# Patient Record
Sex: Male | Born: 1935 | Race: White | Hispanic: No | Marital: Married | Smoking: Former smoker
Health system: Southern US, Community
[De-identification: ages and names within clinical notes are randomized; demographics above are authoritative.]

## PROBLEM LIST (undated history)

## (undated) DIAGNOSIS — N289 Disorder of kidney and ureter, unspecified: Secondary | ICD-10-CM

## (undated) DIAGNOSIS — I447 Left bundle-branch block, unspecified: Secondary | ICD-10-CM

## (undated) HISTORY — PX: BLADDER SURGERY: SHX569

## (undated) HISTORY — DX: Left bundle-branch block, unspecified: I44.7

## (undated) HISTORY — PX: TONSILLECTOMY: SUR1361

## (undated) HISTORY — DX: Disorder of kidney and ureter, unspecified: N28.9

---

## 2006-02-24 ENCOUNTER — Other Ambulatory Visit: Payer: Self-pay

## 2006-03-22 ENCOUNTER — Ambulatory Visit: Payer: Self-pay | Admitting: Urology

## 2009-06-06 ENCOUNTER — Ambulatory Visit: Payer: Self-pay | Admitting: Family Medicine

## 2009-06-12 ENCOUNTER — Ambulatory Visit: Payer: Self-pay | Admitting: Family Medicine

## 2009-06-15 ENCOUNTER — Inpatient Hospital Stay: Payer: Self-pay | Admitting: Student

## 2009-06-18 ENCOUNTER — Ambulatory Visit: Payer: Self-pay | Admitting: Oncology

## 2009-07-18 ENCOUNTER — Ambulatory Visit: Payer: Self-pay | Admitting: Oncology

## 2009-07-21 ENCOUNTER — Ambulatory Visit: Payer: Self-pay | Admitting: Family Medicine

## 2009-08-12 ENCOUNTER — Ambulatory Visit: Payer: Self-pay | Admitting: Vascular Surgery

## 2010-10-02 ENCOUNTER — Emergency Department: Payer: Self-pay | Admitting: Emergency Medicine

## 2011-06-28 ENCOUNTER — Ambulatory Visit: Payer: Self-pay | Admitting: Urology

## 2011-06-28 DIAGNOSIS — Z01810 Encounter for preprocedural cardiovascular examination: Secondary | ICD-10-CM

## 2011-06-29 ENCOUNTER — Encounter: Payer: Self-pay | Admitting: Cardiology

## 2011-07-16 ENCOUNTER — Encounter: Payer: Self-pay | Admitting: Cardiology

## 2011-07-19 ENCOUNTER — Encounter: Payer: Self-pay | Admitting: Cardiology

## 2011-07-19 ENCOUNTER — Ambulatory Visit (INDEPENDENT_AMBULATORY_CARE_PROVIDER_SITE_OTHER): Payer: BC Managed Care – PPO | Admitting: Cardiology

## 2011-07-19 VITALS — BP 130/70 | HR 80 | Ht 70.0 in | Wt 206.0 lb

## 2011-07-19 DIAGNOSIS — Z01810 Encounter for preprocedural cardiovascular examination: Secondary | ICD-10-CM | POA: Insufficient documentation

## 2011-07-19 DIAGNOSIS — R9431 Abnormal electrocardiogram [ECG] [EKG]: Secondary | ICD-10-CM

## 2011-07-19 NOTE — Progress Notes (Signed)
HPI: 76 year old male with no prior cardiac history for evaluation of abnormal electrocardiogram. Patient recently noted to have a left bundle branch block. We were therefore asked to further evaluate. Note he is scheduled to have a small tumor removed from his bladder and will require general anesthesia. He has dyspnea with more extreme activities but not routine activities. No orthopnea, PND, pedal edema, chest pain, claudication or syncope.  Current Outpatient Prescriptions  Medication Sig Dispense Refill  . dexamethasone (DECADRON) 4 MG tablet 5 tabs po weekly       . doxercalciferol (HECTOROL) 0.5 MCG capsule Take 1 mcg by mouth daily.        . insulin glargine (LANTUS) 100 UNIT/ML injection Inject into the skin. Sliding scale       . insulin lispro (HUMALOG) 100 UNIT/ML injection Inject into the skin. Sliding scale       . lanthanum (FOSRENOL) 1000 MG chewable tablet Chew 1,000 mg by mouth 2 (two) times daily with a meal.        . latanoprost (XALATAN) 0.005 % ophthalmic solution 1 drop at bedtime.        Marland Kitchen lenalidomide (REVLIMID) 10 MG capsule Take 10 mg by mouth daily.        . magnesium oxide (MAG-OX) 400 MG tablet Take 400 mg by mouth 2 (two) times daily.          No Known Allergies  Past Medical History  Diagnosis Date  . LBBB (left bundle branch block)   . Multiple myeloma   . Diabetes mellitus   . Renal insufficiency     Past Surgical History  Procedure Date  . Bladder surgery   . Tonsillectomy     History   Social History  . Marital Status: Married    Spouse Name: N/A    Number of Children: 2  . Years of Education: N/A   Occupational History  . Not on file.   Social History Main Topics  . Smoking status: Former Games developer  . Smokeless tobacco: Not on file  . Alcohol Use: No  . Drug Use: Not on file  . Sexually Active: Not on file   Other Topics Concern  . Not on file   Social History Narrative  . No narrative on file    No family history on  file.  ROS: no fevers or chills, productive cough, hemoptysis, dysphasia, odynophagia, melena, hematochezia, dysuria, hematuria, rash, seizure activity, orthopnea, PND, pedal edema, claudication. Remaining systems are negative.  Physical Exam: General:  Well developed/well nourished in NAD Skin warm/dry Patient not depressed No peripheral clubbing Back-normal HEENT-normal/normal eyelids Neck supple/normal carotid upstroke bilaterally; no bruits; no JVD; no thyromegaly chest - CTA/ normal expansion CV - RRR/normal S1 and S2; no murmurs, rubs or gallops;  PMI nondisplaced Abdomen -NT/ND, no HSM, no mass, + bowel sounds, no bruit 2+ femoral pulses, no bruits Ext-no edema, chords, 2+ DP Neuro-grossly nonfocal  ECG 06/29/11 Sinus with LBBB

## 2011-07-19 NOTE — Patient Instructions (Signed)
Your physician has requested that you have an adenosine myoview. For further information please visit [email or web address]. Please follow instruction sheet, as given.   

## 2011-07-19 NOTE — Assessment & Plan Note (Signed)
Adenosine Myoview for risk stratification preoperatively.

## 2011-07-19 NOTE — Assessment & Plan Note (Signed)
Patient with left bundle branch block on electrocardiogram. Also with diabetes mellitus and mild dyspnea on exertion. Scheduled adenosine Myoview to exclude ischemia and to quantify LV function.

## 2011-07-21 ENCOUNTER — Encounter: Payer: Self-pay | Admitting: *Deleted

## 2011-07-28 ENCOUNTER — Encounter (HOSPITAL_COMMUNITY): Payer: BC Managed Care – PPO | Admitting: Radiology

## 2011-07-29 ENCOUNTER — Ambulatory Visit (HOSPITAL_COMMUNITY): Payer: BC Managed Care – PPO | Attending: Cardiology | Admitting: Radiology

## 2011-07-29 VITALS — Ht 70.0 in | Wt 202.0 lb

## 2011-07-29 DIAGNOSIS — R0989 Other specified symptoms and signs involving the circulatory and respiratory systems: Secondary | ICD-10-CM

## 2011-07-29 DIAGNOSIS — I447 Left bundle-branch block, unspecified: Secondary | ICD-10-CM

## 2011-07-29 DIAGNOSIS — E119 Type 2 diabetes mellitus without complications: Secondary | ICD-10-CM

## 2011-07-29 DIAGNOSIS — R9431 Abnormal electrocardiogram [ECG] [EKG]: Secondary | ICD-10-CM

## 2011-07-29 DIAGNOSIS — R55 Syncope and collapse: Secondary | ICD-10-CM

## 2011-07-29 MED ORDER — TECHNETIUM TC 99M TETROFOSMIN IV KIT
33.0000 | PACK | Freq: Once | INTRAVENOUS | Status: AC | PRN
  Administered 2011-07-29: 33 via INTRAVENOUS

## 2011-07-29 MED ORDER — ADENOSINE (DIAGNOSTIC) 3 MG/ML IV SOLN
0.5600 mg/kg | Freq: Once | INTRAVENOUS | Status: AC
  Administered 2011-07-29: 51.3 mg via INTRAVENOUS

## 2011-07-29 MED ORDER — TECHNETIUM TC 99M TETROFOSMIN IV KIT
11.0000 | PACK | Freq: Once | INTRAVENOUS | Status: AC | PRN
  Administered 2011-07-29: 11 via INTRAVENOUS

## 2011-07-29 NOTE — Progress Notes (Signed)
[facility] SITE 3 NUCLEAR MED [address] [place] [address] [phone]  Cardiology Nuclear Med Study  MCKENNA BORUFF is a 76 y.o. male [id number] 01-26-1935   Nuclear Med Background Indication for Stress Test:  Evaluation for Ischemia, Pending Surgical Clearance: removal of tumor from bladder-Dr. Orson Slick, and Abnormal EKG History:  No previous documented CAD Cardiac Risk Factors: History of Smoking, IDDM Type 2 and LBBB  Symptoms:  DOE and Syncope   Nuclear Pre-Procedure Caffeine/Decaff Intake:  None NPO After: 9:00pm   Lungs:  clear IV 0.9% NS with Angio Cath:  22g  IV Site: R Forearm  IV Started by:  Stanton Kidney, EMT-P  Chest Size (in):  44 Cup Size: n/a  Height: 5\' 10"  (1.778 m)  Weight:  202 lb (91.627 kg)  BMI:  Body mass index is 28.98 kg/(m^2). Tech Comments:  NA    Nuclear Med Study 1 or 2 day study: 1 day  Stress Test Type:  Adenosine  Reading MD: Marca Ancona, MD  Order Authorizing Provider:  B.Korynn Kenedy  Resting Radionuclide: Technetium 3m Tetrofosmin  Resting Radionuclide Dose: 11 mCi   Stress Radionuclide:  Technetium 71m Tetrofosmin  Stress Radionuclide Dose: 33 mCi           Stress Protocol Rest HR: 64 Stress HR: 83  Rest BP: 106/57 Stress BP: 105/38  Exercise Time (min): n/a METS: n/a   Predicted Max HR: 146 bpm % Max HR: 56.85 bpm Rate Pressure Product: 8798   Dose of Adenosine (mg):  51.4 Dose of Lexiscan: n/a mg  Dose of Atropine (mg): n/a Dose of Dobutamine: n/a mcg/kg/min (at max HR)  Stress Test Technologist: Milana Na, EMT-P  Nuclear Technologist:  Domenic Polite, CNMT     Rest Procedure:  Myocardial perfusion imaging was performed at rest 45 minutes following the intravenous administration of Technetium 53m Tetrofosmin. Rest ECG: NSR-LBBB  Stress Procedure:  The patient received IV adenosine at 140 mcg/kg/min for 4 minutes.  2nd degree AVB with infusion. There were non Dx changes from LBBB with infusion.   Technetium 3m Tetrofosmin was injected at the 2 minute mark and quantitative spect images were obtained after a 45 minute delay. Stress ECG: Uninteretable due to baseline LBBB  QPS Raw Data Images:  Acquisition technically good; evidence of LVE. Stress Images:  There is decreased uptake in the septum and apex. Rest Images:  There is decreased uptake in the septum and apex, less prominent compared to the stress images. Subtraction (SDS):  These findings are consistent with prior septal and apical infarct and mild peri-infarct ischemia; some of defect may be related to LBBB. Transient Ischemic Dilatation (Normal <1.22):  1.04 Lung/Heart Ratio (Normal <0.45):  .30  Quantitative Gated Spect Images QGS EDV:  141 ml QGS ESV:  82 ml QGS cine images:  Septal and apical hypokinesis. QGS EF: 42%  Impression Exercise Capacity:  Adenosine study with no exercise. BP Response:  Hypotensive blood pressure response. Clinical Symptoms:  No chest pain. ECG Impression:  Baseline:  LBBB.  EKG uninterpretable due to LBBB at rest and stress. Comparison with Prior Nuclear Study: No images to compare  Overall Impression:  Abnormal stress nuclear study with moderate size, partially reversible septal and apical defect consistent with prior septal and apical infarct and mild peri-infarct ischemia; some of defect may be related to LBBB.   Olga Millers

## 2011-08-04 ENCOUNTER — Telehealth: Payer: Self-pay | Admitting: Cardiology

## 2011-08-04 NOTE — Telephone Encounter (Signed)
Pt wife calling wanting to get surgical clearance for skin cancer removal on face as well as bladder cancer. Please return pt call to discuss further.  Please call pt at work if needed,Pt work number: [phone]

## 2011-08-04 NOTE — Telephone Encounter (Signed)
Spoke with pt, aware of abnormal myoview. Follow up scheduled [name]

## 2011-08-17 ENCOUNTER — Ambulatory Visit: Payer: BC Managed Care – PPO | Admitting: Cardiology

## 2011-08-20 ENCOUNTER — Encounter: Payer: Self-pay | Admitting: Cardiology

## 2011-08-20 ENCOUNTER — Ambulatory Visit (INDEPENDENT_AMBULATORY_CARE_PROVIDER_SITE_OTHER): Payer: BC Managed Care – PPO | Admitting: Cardiology

## 2011-08-20 VITALS — BP 128/72 | HR 88 | Ht 70.0 in | Wt 207.0 lb

## 2011-08-20 DIAGNOSIS — I447 Left bundle-branch block, unspecified: Secondary | ICD-10-CM | POA: Insufficient documentation

## 2011-08-20 DIAGNOSIS — R9430 Abnormal result of cardiovascular function study, unspecified: Secondary | ICD-10-CM | POA: Insufficient documentation

## 2011-08-20 NOTE — Patient Instructions (Signed)
Your physician has requested that you have an echocardiogram. Echocardiography is a painless test that uses sound waves to create images of your heart. It provides your doctor with information about the size and shape of your heart and how well your heart's chambers and valves are working. This procedure takes approximately one hour. There are no restrictions for this procedure.   

## 2011-08-20 NOTE — Progress Notes (Signed)
HPI Bill Foster comes in today for evaluation and management of an abnormal nuclear stress study.  He was evaluated by Dr. Jens Foster several weeks ago for a preop clearance for a left bundle-branch block. He is asymptomatic except for some minimal dyspnea and fatigue. He does have diabetes as a major risk factor for coronary disease.  His nuclear study shows an ejection fraction of 42% with abnormal septal and apical Bill Foster motion from his left bundle-branch block. In addition he had a suggestion of some peri-infarct ischemia.  He really wants to get his tumor surgery done as soon as possible.  He has a history of contrast-induced nephropathy. He also has multiple myeloma. He realizes he is not a contrast candidate. Past Medical History  Diagnosis Date  . LBBB (left bundle branch block)   . Multiple myeloma   . Diabetes mellitus   . Renal insufficiency     Past Surgical History  Procedure Date  . Bladder surgery   . Tonsillectomy     No family history on file.  History   Social History  . Marital Status: Married    Spouse Name: N/A    Number of Children: 2  . Years of Education: N/A   Occupational History  . Not on file.   Social History Main Topics  . Smoking status: Former Games developer  . Smokeless tobacco: Not on file  . Alcohol Use: No  . Drug Use: Not on file  . Sexually Active: Not on file   Other Topics Concern  . Not on file   Social History Narrative  . No narrative on file    No Known Allergies  Current Outpatient Prescriptions  Medication Sig Dispense Refill  . dexamethasone (DECADRON) 4 MG tablet 5 tabs po weekly       . insulin glargine (LANTUS) 100 UNIT/ML injection Inject into the skin. Sliding scale       . insulin lispro (HUMALOG) 100 UNIT/ML injection Inject into the skin. Sliding scale       . lanthanum (FOSRENOL) 1000 MG chewable tablet Chew 1,000 mg by mouth 2 (two) times daily with a meal.        . latanoprost (XALATAN) 0.005 % ophthalmic solution  1 drop at bedtime.        Marland Kitchen lenalidomide (REVLIMID) 10 MG capsule Take 10 mg by mouth daily.        . magnesium oxide (MAG-OX) 400 MG tablet Take 400 mg by mouth 2 (two) times daily.        Marland Kitchen doxercalciferol (HECTOROL) 0.5 MCG capsule Take 1 mcg by mouth daily.          ROS Negative other than HPI.   PE   No change from Dr. Ludwig Foster baseline evaluation. Filed Vitals:   08/20/11 1507  BP: 128/72  Pulse: 88  Height: 5\' 10"  (1.778 m)  Weight: 207 lb (93.895 kg)    EKG  Labs and Studies Reviewed.   No results found for this basename: WBC, HGB, HCT, MCV, PLT      Chemistry   No results found for this basename: NA, K, CL, CO2, BUN, CREATININE, GLU   No results found for this basename: CALCIUM, ALKPHOS, AST, ALT, BILITOT       No results found for this basename: CHOL   No results found for this basename: HDL   No results found for this basename: LDLCALC   No results found for this basename: TRIG   No results found for this basename:  CHOLHDL   No results found for this basename: HGBA1C   No results found for this basename: ALT, AST, GGT, ALKPHOS, BILITOT   No results found for this basename: TSH

## 2011-08-25 ENCOUNTER — Institutional Professional Consult (permissible substitution): Payer: BC Managed Care – PPO | Admitting: Internal Medicine

## 2011-08-26 ENCOUNTER — Ambulatory Visit (HOSPITAL_COMMUNITY): Payer: BC Managed Care – PPO | Attending: Cardiology

## 2011-08-26 DIAGNOSIS — I059 Rheumatic mitral valve disease, unspecified: Secondary | ICD-10-CM | POA: Insufficient documentation

## 2011-08-26 DIAGNOSIS — R0609 Other forms of dyspnea: Secondary | ICD-10-CM | POA: Insufficient documentation

## 2011-08-26 DIAGNOSIS — R0989 Other specified symptoms and signs involving the circulatory and respiratory systems: Secondary | ICD-10-CM

## 2011-08-26 DIAGNOSIS — I447 Left bundle-branch block, unspecified: Secondary | ICD-10-CM

## 2011-08-26 DIAGNOSIS — I079 Rheumatic tricuspid valve disease, unspecified: Secondary | ICD-10-CM | POA: Insufficient documentation

## 2011-08-26 DIAGNOSIS — R5383 Other fatigue: Secondary | ICD-10-CM

## 2011-08-30 ENCOUNTER — Ambulatory Visit: Payer: BC Managed Care – PPO | Admitting: Cardiology

## 2011-09-01 ENCOUNTER — Encounter: Payer: Self-pay | Admitting: *Deleted

## 2011-10-04 ENCOUNTER — Ambulatory Visit: Payer: Self-pay | Admitting: Urology

## 2011-10-06 LAB — PATHOLOGY REPORT

## 2011-10-07 ENCOUNTER — Ambulatory Visit: Payer: Self-pay | Admitting: Otolaryngology

## 2011-10-19 ENCOUNTER — Ambulatory Visit: Payer: Self-pay | Admitting: Internal Medicine

## 2011-11-02 LAB — CBC
HGB: 10.4 g/dL — ABNORMAL LOW (ref 13.0–18.0)
MCH: 32.1 pg (ref 26.0–34.0)
MCHC: 33 g/dL (ref 32.0–36.0)
Platelet: 49 10*3/uL — ABNORMAL LOW (ref 150–440)
RDW: 16 % — ABNORMAL HIGH (ref 11.5–14.5)

## 2011-11-03 ENCOUNTER — Inpatient Hospital Stay: Payer: Self-pay | Admitting: Internal Medicine

## 2011-11-03 LAB — BASIC METABOLIC PANEL
Calcium, Total: 8.9 mg/dL (ref 8.5–10.1)
Chloride: 105 mmol/L (ref 98–107)
Co2: 23 mmol/L (ref 21–32)
Creatinine: 4.02 mg/dL — ABNORMAL HIGH (ref 0.60–1.30)
EGFR (African American): 19 — ABNORMAL LOW
Potassium: 4 mmol/L (ref 3.5–5.1)
Sodium: 143 mmol/L (ref 136–145)

## 2011-11-03 LAB — TROPONIN I: Troponin-I: 0.03 ng/mL

## 2011-11-03 LAB — APTT: Activated PTT: 29.2 secs (ref 23.6–35.9)

## 2011-11-03 LAB — CK TOTAL AND CKMB (NOT AT ARMC)
CK-MB: 2.9 ng/mL (ref 0.5–3.6)
CK-MB: 3.1 ng/mL (ref 0.5–3.6)

## 2011-11-04 LAB — COMPREHENSIVE METABOLIC PANEL
Albumin: 3 g/dL — ABNORMAL LOW (ref 3.4–5.0)
Anion Gap: 14 (ref 7–16)
BUN: 57 mg/dL — ABNORMAL HIGH (ref 7–18)
Bilirubin,Total: 0.5 mg/dL (ref 0.2–1.0)
Chloride: 105 mmol/L (ref 98–107)
Creatinine: 3.67 mg/dL — ABNORMAL HIGH (ref 0.60–1.30)
EGFR (African American): 21 — ABNORMAL LOW
Glucose: 115 mg/dL — ABNORMAL HIGH (ref 65–99)
Osmolality: 300 (ref 275–301)
Potassium: 4.2 mmol/L (ref 3.5–5.1)
Sodium: 142 mmol/L (ref 136–145)
Total Protein: 6 g/dL — ABNORMAL LOW (ref 6.4–8.2)

## 2011-11-04 LAB — CBC WITH DIFFERENTIAL/PLATELET
Basophil #: 0 10*3/uL (ref 0.0–0.1)
Eosinophil #: 0.3 10*3/uL (ref 0.0–0.7)
Eosinophil: 3 %
HCT: 27.1 % — ABNORMAL LOW (ref 40.0–52.0)
Lymphocyte %: 12.1 %
MCHC: 33 g/dL (ref 32.0–36.0)
MCV: 98 fL (ref 80–100)
Monocyte %: 22.7 %
Monocytes: 18 %
Neutrophil #: 3.8 10*3/uL (ref 1.4–6.5)
Neutrophil %: 59.8 %
Platelet: 46 10*3/uL — ABNORMAL LOW (ref 150–440)
RDW: 15.5 % — ABNORMAL HIGH (ref 11.5–14.5)
Segmented Neutrophils: 65 %
WBC: 6.3 10*3/uL (ref 3.8–10.6)

## 2011-11-04 LAB — APTT
Activated PTT: 67.9 secs — ABNORMAL HIGH (ref 23.6–35.9)
Activated PTT: 153.1 secs — ABNORMAL HIGH (ref 23.6–35.9)

## 2011-11-04 LAB — MAGNESIUM: Magnesium: 2 mg/dL

## 2011-11-05 LAB — CBC WITH DIFFERENTIAL/PLATELET
Bands: 1 %
Basophil: 2 %
Comment - H1-Com2: NORMAL
Eosinophil: 8 %
HCT: 26.1 % — ABNORMAL LOW (ref 40.0–52.0)
HGB: 8.6 g/dL — ABNORMAL LOW (ref 13.0–18.0)
Lymphocytes: 12 %
MCHC: 33.1 g/dL (ref 32.0–36.0)
Platelet: 54 10*3/uL — ABNORMAL LOW (ref 150–440)
RDW: 15.4 % — ABNORMAL HIGH (ref 11.5–14.5)
Segmented Neutrophils: 62 %
WBC: 5.5 10*3/uL (ref 3.8–10.6)

## 2011-11-05 LAB — BASIC METABOLIC PANEL
Anion Gap: 14 (ref 7–16)
BUN: 51 mg/dL — ABNORMAL HIGH (ref 7–18)
Calcium, Total: 8.5 mg/dL (ref 8.5–10.1)
Chloride: 110 mmol/L — ABNORMAL HIGH (ref 98–107)
Co2: 21 mmol/L (ref 21–32)
Creatinine: 3.38 mg/dL — ABNORMAL HIGH (ref 0.60–1.30)
EGFR (African American): 23 — ABNORMAL LOW
EGFR (Non-African Amer.): 19 — ABNORMAL LOW
Glucose: 80 mg/dL (ref 65–99)
Osmolality: 301 (ref 275–301)
Potassium: 3.9 mmol/L (ref 3.5–5.1)
Sodium: 145 mmol/L (ref 136–145)

## 2011-11-05 LAB — APTT
Activated PTT: >160 secs (ref 23.6–35.9)
Activated PTT: 147.1 secs — ABNORMAL HIGH (ref 23.6–35.9)

## 2011-11-05 LAB — PROTIME-INR
INR: 1.1
Prothrombin Time: 15 secs — ABNORMAL HIGH (ref 11.5–14.7)

## 2011-11-05 LAB — HEMOGLOBIN A1C: Hemoglobin A1C: 8.2 % — ABNORMAL HIGH (ref 4.2–6.3)

## 2011-11-06 LAB — BASIC METABOLIC PANEL
Anion Gap: 14 (ref 7–16)
BUN: 49 mg/dL — ABNORMAL HIGH (ref 7–18)
Calcium, Total: 8.6 mg/dL (ref 8.5–10.1)
Chloride: 110 mmol/L — ABNORMAL HIGH (ref 98–107)
Co2: 21 mmol/L (ref 21–32)
Osmolality: 303 (ref 275–301)
Potassium: 3.9 mmol/L (ref 3.5–5.1)
Sodium: 145 mmol/L (ref 136–145)

## 2011-11-06 LAB — URINALYSIS, COMPLETE
Bacteria: NONE SEEN
Glucose,UR: 50 mg/dL (ref 0–75)
Ketone: NEGATIVE
Leukocyte Esterase: NEGATIVE
Protein: 30
RBC,UR: 1165 /HPF (ref 0–5)
Specific Gravity: 1.008 (ref 1.003–1.030)
WBC UR: 9 /HPF (ref 0–5)

## 2011-11-06 LAB — APTT
Activated PTT: >160 secs (ref 23.6–35.9)
Activated PTT: 80.7 secs — ABNORMAL HIGH (ref 23.6–35.9)

## 2011-11-06 LAB — CBC WITH DIFFERENTIAL/PLATELET
Basophil %: 1.6 %
Basophil: 2 %
Eosinophil #: 0.4 10*3/uL (ref 0.0–0.7)
Eosinophil %: 8.7 %
Eosinophil: 10 %
HCT: 26.3 % — ABNORMAL LOW (ref 40.0–52.0)
HGB: 8.8 g/dL — ABNORMAL LOW (ref 13.0–18.0)
MCH: 32.3 pg (ref 26.0–34.0)
MCHC: 33.3 g/dL (ref 32.0–36.0)
Monocyte #: 1.1 10*3/uL — ABNORMAL HIGH (ref 0.0–0.7)
Monocytes: 19 %
Neutrophil #: 2.4 10*3/uL (ref 1.4–6.5)
RBC: 2.71 10*6/uL — ABNORMAL LOW (ref 4.40–5.90)
Segmented Neutrophils: 48 %

## 2011-11-06 LAB — PROTIME-INR
INR: 1.3
Prothrombin Time: 16.3 secs — ABNORMAL HIGH (ref 11.5–14.7)

## 2011-11-07 LAB — CBC WITH DIFFERENTIAL/PLATELET
Bands: 3 %
Basophil #: 0.1 10*3/uL (ref 0.0–0.1)
Eosinophil %: 10.4 %
Eosinophil: 9 %
Lymphocyte #: 0.8 10*3/uL — ABNORMAL LOW (ref 1.0–3.6)
Lymphocyte %: 19.4 %
Lymphocytes: 21 %
MCHC: 33.7 g/dL (ref 32.0–36.0)
MCV: 97 fL (ref 80–100)
Monocyte %: 20.6 %
Monocytes: 17 %
Neutrophil %: 48 %
Platelet: 70 10*3/uL — ABNORMAL LOW (ref 150–440)
RBC: 2.69 10*6/uL — ABNORMAL LOW (ref 4.40–5.90)
RDW: 15.2 % — ABNORMAL HIGH (ref 11.5–14.5)
Segmented Neutrophils: 47 %
WBC: 4.3 10*3/uL (ref 3.8–10.6)

## 2011-11-07 LAB — PROTIME-INR: Prothrombin Time: 17.7 secs — ABNORMAL HIGH (ref 11.5–14.7)

## 2011-11-07 LAB — BASIC METABOLIC PANEL
Calcium, Total: 8.7 mg/dL (ref 8.5–10.1)
Chloride: 112 mmol/L — ABNORMAL HIGH (ref 98–107)
Co2: 19 mmol/L — ABNORMAL LOW (ref 21–32)
Creatinine: 3.2 mg/dL — ABNORMAL HIGH (ref 0.60–1.30)
EGFR (African American): 25 — ABNORMAL LOW
Potassium: 4.2 mmol/L (ref 3.5–5.1)
Sodium: 146 mmol/L — ABNORMAL HIGH (ref 136–145)

## 2011-11-08 LAB — CBC WITH DIFFERENTIAL/PLATELET
Basophil %: 2 %
Eosinophil %: 9.7 %
HCT: 25.4 % — ABNORMAL LOW (ref 40.0–52.0)
HGB: 8.4 g/dL — ABNORMAL LOW (ref 13.0–18.0)
Lymphocyte #: 0.9 10*3/uL — ABNORMAL LOW (ref 1.0–3.6)
Lymphocyte %: 21.1 %
MCHC: 33.2 g/dL (ref 32.0–36.0)
MCV: 97 fL (ref 80–100)
Monocyte #: 0.8 10*3/uL — ABNORMAL HIGH (ref 0.0–0.7)
Monocyte %: 19.5 %
Platelet: 76 10*3/uL — ABNORMAL LOW (ref 150–440)
RDW: 15.3 % — ABNORMAL HIGH (ref 11.5–14.5)
WBC: 4.2 10*3/uL (ref 3.8–10.6)

## 2011-11-08 LAB — PROTIME-INR
INR: 1.9
Prothrombin Time: 22.4 secs — ABNORMAL HIGH (ref 11.5–14.7)

## 2011-11-08 LAB — RENAL FUNCTION PANEL
Albumin: 2.6 g/dL — ABNORMAL LOW (ref 3.4–5.0)
Anion Gap: 14 (ref 7–16)
BUN: 42 mg/dL — ABNORMAL HIGH (ref 7–18)
Calcium, Total: 8.7 mg/dL (ref 8.5–10.1)
Chloride: 110 mmol/L — ABNORMAL HIGH (ref 98–107)
EGFR (African American): 25 — ABNORMAL LOW
Glucose: 151 mg/dL — ABNORMAL HIGH (ref 65–99)
Osmolality: 298 (ref 275–301)
Phosphorus: 4.3 mg/dL (ref 2.5–4.9)

## 2011-11-08 LAB — APTT: Activated PTT: 81.9 secs — ABNORMAL HIGH (ref 23.6–35.9)

## 2011-11-19 ENCOUNTER — Ambulatory Visit: Payer: Self-pay | Admitting: Internal Medicine

## 2011-11-29 ENCOUNTER — Ambulatory Visit: Payer: Self-pay | Admitting: Ophthalmology

## 2011-11-29 LAB — PROTIME-INR
INR: 1.8
Prothrombin Time: 21.5 secs — ABNORMAL HIGH (ref 11.5–14.7)

## 2011-12-27 ENCOUNTER — Ambulatory Visit: Payer: Self-pay | Admitting: Ophthalmology

## 2012-02-18 ENCOUNTER — Other Ambulatory Visit: Payer: Self-pay | Admitting: Family Medicine

## 2012-02-18 LAB — CBC WITH DIFFERENTIAL/PLATELET
Basophil #: 0 10*3/uL (ref 0.0–0.1)
Eosinophil #: 0 10*3/uL (ref 0.0–0.7)
HCT: 30.9 % — ABNORMAL LOW (ref 40.0–52.0)
HGB: 10.3 g/dL — ABNORMAL LOW (ref 13.0–18.0)
Lymphocyte #: 0.5 10*3/uL — ABNORMAL LOW (ref 1.0–3.6)
MCH: 32 pg (ref 26.0–34.0)
MCHC: 33.3 g/dL (ref 32.0–36.0)
MCV: 96 fL (ref 80–100)
Monocyte #: 0.5 x10 3/mm (ref 0.2–1.0)
Monocyte %: 16.5 %
Neutrophil #: 2 10*3/uL (ref 1.4–6.5)
Neutrophil %: 66.6 %
Platelet: 64 10*3/uL — ABNORMAL LOW (ref 150–440)
RDW: 16.3 % — ABNORMAL HIGH (ref 11.5–14.5)
WBC: 3.1 10*3/uL — ABNORMAL LOW (ref 3.8–10.6)

## 2012-03-09 ENCOUNTER — Ambulatory Visit: Payer: Self-pay | Admitting: Urology

## 2012-03-09 LAB — BASIC METABOLIC PANEL
Anion Gap: 12 (ref 7–16)
BUN: 49 mg/dL — ABNORMAL HIGH (ref 7–18)
Calcium, Total: 9 mg/dL (ref 8.5–10.1)
Chloride: 103 mmol/L (ref 98–107)
Co2: 22 mmol/L (ref 21–32)
Creatinine: 3.18 mg/dL — ABNORMAL HIGH (ref 0.60–1.30)
EGFR (African American): 21 — ABNORMAL LOW
EGFR (Non-African Amer.): 18 — ABNORMAL LOW
Glucose: 164 mg/dL — ABNORMAL HIGH (ref 65–99)
Potassium: 3.9 mmol/L (ref 3.5–5.1)
Sodium: 137 mmol/L (ref 136–145)

## 2012-03-20 ENCOUNTER — Ambulatory Visit: Payer: Self-pay | Admitting: Urology

## 2012-03-20 LAB — CBC WITH DIFFERENTIAL/PLATELET
Basophil #: 0.1 10*3/uL (ref 0.0–0.1)
Basophil %: 1.3 %
Eosinophil %: 3.7 %
HCT: 28.4 % — ABNORMAL LOW (ref 40.0–52.0)
Lymphocyte #: 0.8 10*3/uL — ABNORMAL LOW (ref 1.0–3.6)
MCHC: 33.1 g/dL (ref 32.0–36.0)
Neutrophil #: 3.4 10*3/uL (ref 1.4–6.5)
Neutrophil %: 60.1 %
Platelet: 92 10*3/uL — ABNORMAL LOW (ref 150–440)
RBC: 2.92 10*6/uL — ABNORMAL LOW (ref 4.40–5.90)
WBC: 5.6 10*3/uL (ref 3.8–10.6)

## 2012-03-20 LAB — PROTIME-INR: Prothrombin Time: 14.4 secs (ref 11.5–14.7)

## 2012-09-04 ENCOUNTER — Ambulatory Visit: Payer: Self-pay | Admitting: Vascular Surgery

## 2012-09-04 LAB — PROTIME-INR: INR: 1.6

## 2013-05-21 ENCOUNTER — Ambulatory Visit: Payer: Self-pay | Admitting: Urology

## 2013-05-21 LAB — BASIC METABOLIC PANEL
Anion Gap: 10 (ref 7–16)
BUN: 76 mg/dL — ABNORMAL HIGH (ref 7–18)
Calcium, Total: 8.9 mg/dL (ref 8.5–10.1)
Chloride: 107 mmol/L (ref 98–107)
EGFR (African American): 18 — ABNORMAL LOW
EGFR (Non-African Amer.): 15 — ABNORMAL LOW
Glucose: 240 mg/dL — ABNORMAL HIGH (ref 65–99)
Osmolality: 308 (ref 275–301)
Potassium: 4.4 mmol/L (ref 3.5–5.1)

## 2013-05-21 LAB — CBC WITH DIFFERENTIAL/PLATELET
Basophil #: 0 10*3/uL (ref 0.0–0.1)
Basophil %: 1.3 %
Eosinophil %: 12.6 %
HCT: 27.1 % — ABNORMAL LOW (ref 40.0–52.0)
HGB: 9.5 g/dL — ABNORMAL LOW (ref 13.0–18.0)
Lymphocyte #: 0.5 10*3/uL — ABNORMAL LOW (ref 1.0–3.6)
MCHC: 35.2 g/dL (ref 32.0–36.0)
MCV: 102 fL — ABNORMAL HIGH (ref 80–100)
Monocyte %: 30.3 %
Neutrophil #: 1.2 10*3/uL — ABNORMAL LOW (ref 1.4–6.5)
Neutrophil %: 40.2 %
Platelet: 49 10*3/uL — ABNORMAL LOW (ref 150–440)

## 2013-06-18 ENCOUNTER — Ambulatory Visit: Payer: Self-pay | Admitting: Hematology and Oncology

## 2013-06-27 ENCOUNTER — Ambulatory Visit: Payer: Self-pay | Admitting: Urology

## 2013-07-12 LAB — BASIC METABOLIC PANEL
Calcium, Total: 8.7 mg/dL (ref 8.5–10.1)
Co2: 16 mmol/L — ABNORMAL LOW (ref 21–32)
Creatinine: 4.85 mg/dL — ABNORMAL HIGH (ref 0.60–1.30)
Glucose: 284 mg/dL — ABNORMAL HIGH (ref 65–99)
Potassium: 4.4 mmol/L (ref 3.5–5.1)

## 2013-07-12 LAB — CBC WITH DIFFERENTIAL/PLATELET
Comment - H1-Com3: NORMAL
HCT: 30.2 % — ABNORMAL LOW (ref 40.0–52.0)
HGB: 10.2 g/dL — ABNORMAL LOW (ref 13.0–18.0)
Lymphocytes: 40 %
MCH: 33 pg (ref 26.0–34.0)
MCHC: 33.9 g/dL (ref 32.0–36.0)
Monocytes: 18 %
RDW: 20.1 % — ABNORMAL HIGH (ref 11.5–14.5)
WBC: 1.3 10*3/uL — CL (ref 3.8–10.6)

## 2013-07-12 LAB — TROPONIN I: Troponin-I: 1.3 ng/mL — ABNORMAL HIGH

## 2013-07-12 LAB — HEPATIC FUNCTION PANEL A (ARMC)
Albumin: 3.3 g/dL — ABNORMAL LOW (ref 3.4–5.0)
Bilirubin,Total: 0.3 mg/dL (ref 0.2–1.0)
SGPT (ALT): 69 U/L (ref 12–78)
Total Protein: 6.8 g/dL (ref 6.4–8.2)

## 2013-07-12 LAB — PROTIME-INR
INR: 1.1
Prothrombin Time: 14.8 secs — ABNORMAL HIGH (ref 11.5–14.7)

## 2013-07-13 ENCOUNTER — Inpatient Hospital Stay: Payer: Self-pay | Admitting: Family Medicine

## 2013-07-13 DIAGNOSIS — I059 Rheumatic mitral valve disease, unspecified: Secondary | ICD-10-CM

## 2013-07-13 DIAGNOSIS — I214 Non-ST elevation (NSTEMI) myocardial infarction: Secondary | ICD-10-CM

## 2013-07-13 LAB — CK TOTAL AND CKMB (NOT AT ARMC)
CK, Total: 98 U/L (ref 35–232)
CK, Total: 72 U/L (ref 35–232)
CK-MB: 3.2 ng/mL (ref 0.5–3.6)
CK-MB: 2.3 ng/mL (ref 0.5–3.6)
CK-MB: 2 ng/mL (ref 0.5–3.6)

## 2013-07-13 LAB — CBC WITH DIFFERENTIAL/PLATELET
Basophil #: 0 10*3/uL (ref 0.0–0.1)
Eosinophil %: 0.3 %
HCT: 23.8 % — ABNORMAL LOW (ref 40.0–52.0)
Lymphocyte #: 0.2 10*3/uL — ABNORMAL LOW (ref 1.0–3.6)
MCHC: 34.7 g/dL (ref 32.0–36.0)
Monocyte #: 0.2 x10 3/mm (ref 0.2–1.0)
Monocyte %: 26.9 %
Platelet: 14 10*3/uL — CL (ref 150–440)
RDW: 19.4 % — ABNORMAL HIGH (ref 11.5–14.5)
WBC: 0.8 10*3/uL — CL (ref 3.8–10.6)

## 2013-07-13 LAB — TROPONIN I
Troponin-I: 1.2 ng/mL — ABNORMAL HIGH
Troponin-I: 0.85 ng/mL — ABNORMAL HIGH

## 2013-07-14 LAB — BASIC METABOLIC PANEL
Anion Gap: 10 (ref 7–16)
Co2: 18 mmol/L — ABNORMAL LOW (ref 21–32)
Creatinine: 5.04 mg/dL — ABNORMAL HIGH (ref 0.60–1.30)
EGFR (African American): 12 — ABNORMAL LOW
EGFR (Non-African Amer.): 10 — ABNORMAL LOW
Glucose: 114 mg/dL — ABNORMAL HIGH (ref 65–99)
Osmolality: 305 (ref 275–301)
Sodium: 139 mmol/L (ref 136–145)

## 2013-07-14 LAB — CBC WITH DIFFERENTIAL/PLATELET
Basophil #: 0 10*3/uL (ref 0.0–0.1)
HCT: 21.8 % — ABNORMAL LOW (ref 40.0–52.0)
Lymphocyte %: 20.5 %
MCV: 93 fL (ref 80–100)
Monocyte #: 0.3 x10 3/mm (ref 0.2–1.0)
Neutrophil #: 0.5 10*3/uL — ABNORMAL LOW (ref 1.4–6.5)
Neutrophil %: 53.3 %
RBC: 2.34 10*6/uL — ABNORMAL LOW (ref 4.40–5.90)
RDW: 20.8 % — ABNORMAL HIGH (ref 11.5–14.5)
WBC: 1 10*3/uL — CL (ref 3.8–10.6)

## 2013-07-14 LAB — LIPID PANEL
Cholesterol: 128 mg/dL (ref 0–200)
HDL Cholesterol: 54 mg/dL (ref 40–60)
Triglycerides: 118 mg/dL (ref 0–200)

## 2013-07-15 LAB — BASIC METABOLIC PANEL
Anion Gap: 6 — ABNORMAL LOW (ref 7–16)
BUN: 93 mg/dL — ABNORMAL HIGH (ref 7–18)
Calcium, Total: 8.4 mg/dL — ABNORMAL LOW (ref 8.5–10.1)
Chloride: 110 mmol/L — ABNORMAL HIGH (ref 98–107)
EGFR (African American): 12 — ABNORMAL LOW
EGFR (Non-African Amer.): 10 — ABNORMAL LOW
Glucose: 130 mg/dL — ABNORMAL HIGH (ref 65–99)
Osmolality: 304 (ref 275–301)
Potassium: 4.8 mmol/L (ref 3.5–5.1)
Sodium: 137 mmol/L (ref 136–145)

## 2013-07-15 LAB — CBC WITH DIFFERENTIAL/PLATELET
Comment - H1-Com6: NORMAL
Lymphocytes: 6 %
MCHC: 35.2 g/dL (ref 32.0–36.0)
Platelet: 16 10*3/uL — CL (ref 150–440)
RBC: 2.11 10*6/uL — ABNORMAL LOW (ref 4.40–5.90)
RDW: 20.3 % — ABNORMAL HIGH (ref 11.5–14.5)
Segmented Neutrophils: 68 %
WBC: 1.9 10*3/uL — CL (ref 3.8–10.6)

## 2013-07-16 LAB — CBC WITH DIFFERENTIAL/PLATELET
Bands: 8 %
HCT: 21.2 % — ABNORMAL LOW (ref 40.0–52.0)
HGB: 7.3 g/dL — ABNORMAL LOW (ref 13.0–18.0)
Lymphocytes: 7 %
MCV: 95 fL (ref 80–100)
Metamyelocyte: 4 %
Monocytes: 8 %
NRBC/100 WBC: 2 /
RBC: 2.24 10*6/uL — ABNORMAL LOW (ref 4.40–5.90)
RDW: 19.5 % — ABNORMAL HIGH (ref 11.5–14.5)
Segmented Neutrophils: 73 %
WBC: 2.4 10*3/uL — ABNORMAL LOW (ref 3.8–10.6)

## 2013-07-16 LAB — KAPPA/LAMBDA FREE LIGHT CHAINS (ARMC)

## 2013-07-16 LAB — RENAL FUNCTION PANEL
Albumin: 2.4 g/dL — ABNORMAL LOW (ref 3.4–5.0)
Anion Gap: 7 (ref 7–16)
Calcium, Total: 8.8 mg/dL (ref 8.5–10.1)
Chloride: 108 mmol/L — ABNORMAL HIGH (ref 98–107)
Co2: 20 mmol/L — ABNORMAL LOW (ref 21–32)
Osmolality: 308 (ref 275–301)
Phosphorus: 4.6 mg/dL (ref 2.5–4.9)
Sodium: 135 mmol/L — ABNORMAL LOW (ref 136–145)

## 2013-07-17 LAB — CBC WITH DIFFERENTIAL/PLATELET
Lymphocytes: 16 %
MCH: 32.5 pg (ref 26.0–34.0)
MCV: 94 fL (ref 80–100)
Monocytes: 3 %
NRBC/100 WBC: 3 /
RBC: 2.19 10*6/uL — ABNORMAL LOW (ref 4.40–5.90)
Segmented Neutrophils: 81 %
WBC: 4.4 10*3/uL (ref 3.8–10.6)

## 2013-07-17 LAB — BASIC METABOLIC PANEL
Anion Gap: 8 (ref 7–16)
BUN: 108 mg/dL — ABNORMAL HIGH (ref 7–18)
Calcium, Total: 9 mg/dL (ref 8.5–10.1)
Creatinine: 4.8 mg/dL — ABNORMAL HIGH (ref 0.60–1.30)
EGFR (Non-African Amer.): 11 — ABNORMAL LOW
Glucose: 149 mg/dL — ABNORMAL HIGH (ref 65–99)
Potassium: 4.6 mmol/L (ref 3.5–5.1)

## 2013-07-18 ENCOUNTER — Ambulatory Visit: Payer: Self-pay | Admitting: Hematology and Oncology

## 2013-07-18 LAB — BASIC METABOLIC PANEL
BUN: 103 mg/dL — ABNORMAL HIGH (ref 7–18)
Calcium, Total: 8.9 mg/dL (ref 8.5–10.1)
Chloride: 112 mmol/L — ABNORMAL HIGH (ref 98–107)
Creatinine: 4.36 mg/dL — ABNORMAL HIGH (ref 0.60–1.30)
EGFR (African American): 14 — ABNORMAL LOW
Glucose: 86 mg/dL (ref 65–99)
Osmolality: 315 (ref 275–301)
Potassium: 4.2 mmol/L (ref 3.5–5.1)
Sodium: 142 mmol/L (ref 136–145)

## 2013-07-19 LAB — BASIC METABOLIC PANEL
Anion Gap: 10 (ref 7–16)
BUN: 86 mg/dL — ABNORMAL HIGH (ref 7–18)
Calcium, Total: 9 mg/dL (ref 8.5–10.1)
Chloride: 111 mmol/L — ABNORMAL HIGH (ref 98–107)
Co2: 20 mmol/L — ABNORMAL LOW (ref 21–32)
Creatinine: 4.03 mg/dL — ABNORMAL HIGH (ref 0.60–1.30)
EGFR (African American): 16 — ABNORMAL LOW
Osmolality: 311 (ref 275–301)
Potassium: 4.5 mmol/L (ref 3.5–5.1)
Sodium: 141 mmol/L (ref 136–145)

## 2013-07-19 LAB — CBC WITH DIFFERENTIAL/PLATELET
Bands: 1 %
Basophil: 1 %
Eosinophil: 1 %
HCT: 24.5 % — ABNORMAL LOW (ref 40.0–52.0)
HGB: 8.5 g/dL — ABNORMAL LOW (ref 13.0–18.0)
Lymphocytes: 1 %
MCH: 32.8 pg (ref 26.0–34.0)
MCV: 95 fL (ref 80–100)
Monocytes: 6 %
Myelocyte: 1 %
NRBC/100 WBC: 2 /
Platelet: 31 10*3/uL — ABNORMAL LOW (ref 150–440)
RDW: 20 % — ABNORMAL HIGH (ref 11.5–14.5)
Segmented Neutrophils: 89 %
WBC: 8.8 10*3/uL (ref 3.8–10.6)

## 2013-07-19 LAB — PRO B NATRIURETIC PEPTIDE: B-Type Natriuretic Peptide: 31123 pg/mL — ABNORMAL HIGH (ref 0–450)

## 2013-07-20 LAB — BASIC METABOLIC PANEL
Anion Gap: 9 (ref 7–16)
Calcium, Total: 8.7 mg/dL (ref 8.5–10.1)
Chloride: 111 mmol/L — ABNORMAL HIGH (ref 98–107)
Co2: 19 mmol/L — ABNORMAL LOW (ref 21–32)
Creatinine: 3.82 mg/dL — ABNORMAL HIGH (ref 0.60–1.30)
EGFR (African American): 17 — ABNORMAL LOW
EGFR (Non-African Amer.): 14 — ABNORMAL LOW
Glucose: 245 mg/dL — ABNORMAL HIGH (ref 65–99)
Osmolality: 314 (ref 275–301)
Potassium: 5 mmol/L (ref 3.5–5.1)
Sodium: 139 mmol/L (ref 136–145)

## 2013-07-20 LAB — CBC WITH DIFFERENTIAL/PLATELET
Basophil #: 0 10*3/uL (ref 0.0–0.1)
Eosinophil %: 0 %
HCT: 23.3 % — ABNORMAL LOW (ref 40.0–52.0)
HGB: 7.9 g/dL — ABNORMAL LOW (ref 13.0–18.0)
Lymphocyte #: 0.4 10*3/uL — ABNORMAL LOW (ref 1.0–3.6)
Lymphocyte %: 3.1 %
MCH: 32.1 pg (ref 26.0–34.0)
Monocyte #: 1.2 x10 3/mm — ABNORMAL HIGH (ref 0.2–1.0)
Monocyte %: 9 %
RBC: 2.46 10*6/uL — ABNORMAL LOW (ref 4.40–5.90)
RDW: 19.9 % — ABNORMAL HIGH (ref 11.5–14.5)
WBC: 13 10*3/uL — ABNORMAL HIGH (ref 3.8–10.6)

## 2013-07-27 ENCOUNTER — Ambulatory Visit: Payer: Self-pay | Admitting: Hematology and Oncology

## 2013-08-18 ENCOUNTER — Ambulatory Visit: Payer: Self-pay | Admitting: Hematology and Oncology

## 2013-11-01 ENCOUNTER — Telehealth: Payer: Self-pay

## 2013-11-01 NOTE — Telephone Encounter (Signed)
[facility]

## 2013-11-18 DEATH — deceased

## 2015-01-24 IMAGING — CR DG CHEST 1V PORT
1 series · 1 of 1 positions shown · non-contrast
Comparison: none

REASON FOR EXAM: Chest Pain
COMMENTS:

[ap]
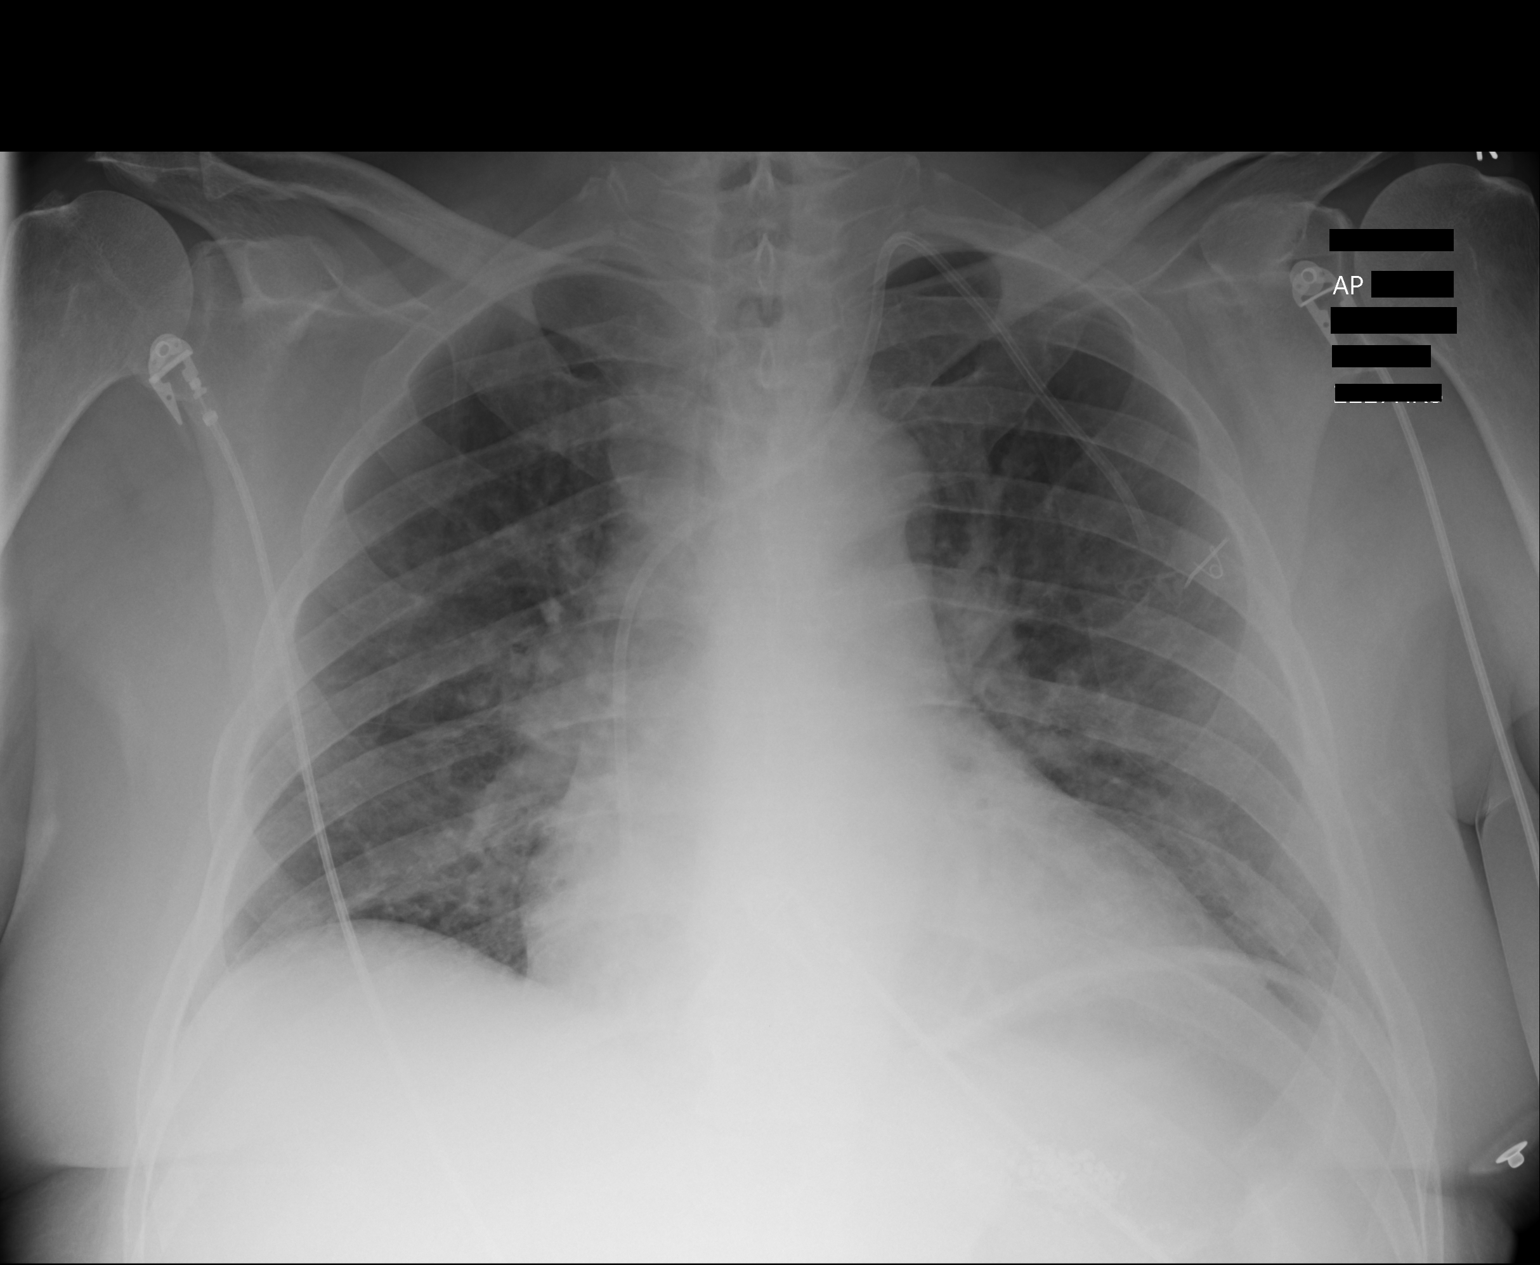

[1 of 1 positions shown; findings below may reference images not displayed]

PROCEDURE:     DXR - DXR PORTABLE CHEST SINGLE VIEW  - July 12, 2013 [DATE]

RESULT:     Comparison is made to the study of 11/04/2011. There is a
left-sided Port-A-Cath device present with a dual lumen configuration. The
distal tip ear to project in the right atrium. Patchy areas of increased
density are seen in the mid and lower lung zones bilaterally which may
represent areas of atelectasis or infiltrate. There does not appear to be
significant effusion. The heart is borderline enlarged. There is no
pneumothorax.
IMPRESSION: Patchy areas of atelectasis or infiltrate.

[REDACTED]

## 2015-01-25 IMAGING — NM NM LUNG SCAN
2 series · 16 of 16 positions shown · non-contrast
Comparison: none

REASON FOR EXAM: PE
COMMENTS:

[Series 1000: lung ventilation · 3.90mm/px · 4 acquisitions, 8 frames shown]
[im 1/4]
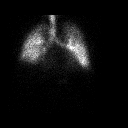
[im 1/4]
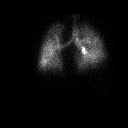
[im 2/4]
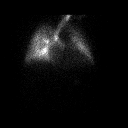
[im 2/4]
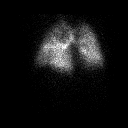
[im 3/4]
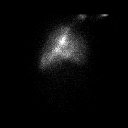
[im 3/4]
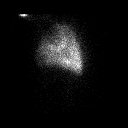
[im 4/4]
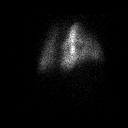
[im 4/4]
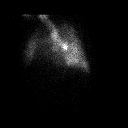

[Series 1000: lung perfusion · 1.95mm/px · 4 acquisitions, 8 frames shown]
[im 1/4]
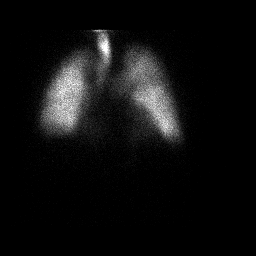
[im 1/4]
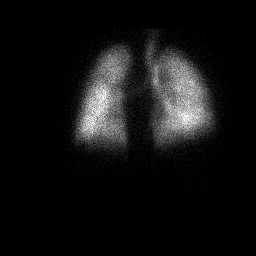
[im 2/4]
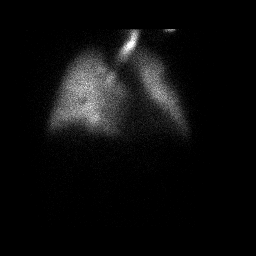
[im 2/4]
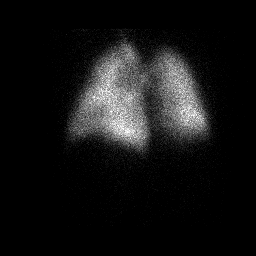
[im 3/4]
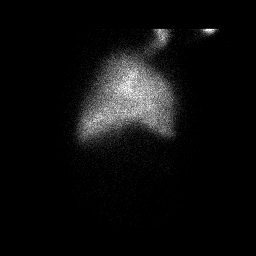
[im 3/4]
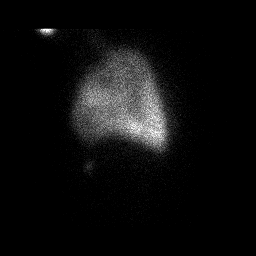
[im 4/4]
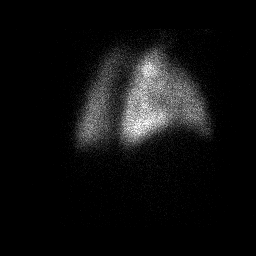
[im 4/4]
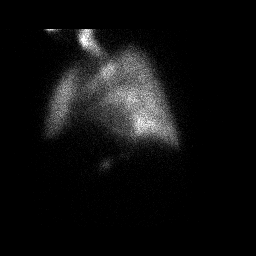

[16 of 16 positions shown; findings below may reference images not displayed]

PROCEDURE:     NM  - NM VQ LUNG SCAN  - [DATE] [DATE] [DATE]  [DATE]

RESULT:

Procedure:  Nuclear Medicine ventilation/perfusion evaluation was performed
per standard protocol.

The study was compared to a frontal view of the chest dated 07/12/2013.

Radiopharmaceutical: 4.29 mCi of technetium 99m labeled DTPA during the
ventilation portion administered via nebulizer and perfusion portion
mCi of technetium 99m labeled MAA via right hand injection.

Scintigraphic imaging of the chest was performed utilizing standard
obliquities during the ventilation and perfusion portions of the study.
FINDINGS: Frontal view of the chest demonstrates shallow inspiration. There
is mild prominence of the interstitial markings. No focal regions of
consolidation or focal infiltrates identified. A left sided central venous
catheter is identified with the tip projecting in the region of the right
atrium.

There is no evidence of pleural-based wedge-shaped ventilation perfusion
mismatched defects. The patient has taken a shallow inspiration.
IMPRESSION: Low probability Nuclear Medicine ventilation/perfusion
evaluation for pulmonary arterial embolic disease.

## 2015-02-04 NOTE — Op Note (Signed)
PATIENT Bill Foster, SPRAGG MR#:  [id number] DATE OF BIRTH:  05-08-1935  DATE OF PROCEDURE:  09/04/2012  PREOPERATIVE DIAGNOSES:  1. History of deep venous thrombosis.  2. Status post inferior vena cava filter placement.  3. Chronic kidney disease.  4. Multiple myeloma.   POSTOPERATIVE DIAGNOSES:  1. History of deep venous thrombosis.  2. Status post inferior vena cava filter placement.  3. Chronic kidney disease.  4. Multiple myeloma.   PROCEDURES:  1. Ultrasound guidance for vascular access, right jugular vein.  2. Catheter placement into inferior vena cava from right jugular vein approach.  3. Inferior venacavogram.  4. Retrieval of [employer] inferior vena cava filter.   SURGEON: Algernon Huxley, M.D.   ANESTHESIA: Local with moderate conscious sedation.   ESTIMATED BLOOD LOSS: Minimal.   FLUOROSCOPY TIME: Approximately one minute.   CONTRAST USED: 15 mL.   INDICATION FOR PROCEDURE: The patient is a 80 year old white male with previous IVC filter placement for extensive thrombosis and concern for bleeding with anticoagulation. He ultimately took anticoagulation and did reasonably well and he can now have his filter retrieved as he has completed his course and has no acute deep vein thrombosis at this time.   DESCRIPTION OF PROCEDURE: The patient was brought to the vascular interventional radiology suite, right neck was sterilely prepped and draped and a sterile surgical field was created. The right jugular vein was visualized with ultrasound and found to widely patent. It was then accessed under direct ultrasound guidance without difficulty with a Seldinger needle. A stiff-angled Glidewire was used to navigate into the inferior vena cava. After skin nick and dilatation, the delivery sheath was placed over the wire and the wire and inner dilator were removed. I then performed inferior venacavogram showed a patent IVC with the filter in excellent location, in the infrarenal vena  cava. The snare was then used to capture the hook on the top of the filter and the sheath was advanced collapsing the filter and removing it in its entirety. It was then removed. The delivery sheath was removed, pressure was held on the neck, and a sterile dressing was placed. The patient tolerated the procedure well and was taken to the recovery room in stable condition. ____________________________ Algernon Huxley, MD jsd:slb D: 09/04/2012 08:56:10 ET T: 09/04/2012 10:15:51 ET JOB#: [id number]  cc: Algernon Huxley, MD, <Dictator> Richard L. Rosanna Randy, MD Algernon Huxley MD ELECTRONICALLY SIGNED 09/05/2012 15:04

## 2015-02-07 NOTE — Consult Note (Signed)
Brief Consult Note: Diagnosis: NSTEMI unclear if type 1 or type 2 supply demand ischemia, advanced multiple myoloma, CKD.   Patient was seen by consultant.   Consult note dictated.   Comments: limited managment options from a cardiac standpoint due to pancytopenia and renal failure. Agree with no anticoagulation.  Check echo. start a small dose beta blocker.  Electronic Signatures: Lorine BearsArida, Muhammad (MD)  (Signed 26-Sep-14 18:18)  Authored: Brief Consult Note   Last Updated: 26-Sep-14 18:18 by Lorine BearsArida, Muhammad (MD)

## 2015-02-07 NOTE — Discharge Summary (Signed)
PATIENT NAMEJEJUAN, Foster MR#:  [id number] DATE OF BIRTH:  06/26/1935  DATE OF ADMISSION:  07/13/2013 DATE OF DISCHARGE:  07/20/2013  My discharge summary covers the days from October 1st to October 3rd. Please refer to interim discharge summary dictated on July 17, 2013 by Dr. Vianne Bulls.  CURRENT DIAGNOSES: Are still: 1.  Chest pain secondary to non-ST-elevation myocardial infarction. 2.  Acute on chronic kidney disease stage IV. 3.  Bronchitis and pneumonia with thrombocytopenia and neutropenia.  4.  Multiple myeloma refractory to chemotherapy.  5.  Chronic systolic heart failure with ejection fraction of 35%.  6.  History of metabolic acidosis.  7.  Type 2 diabetes, insulin-dependent.   LABORATORY DATA: From the days mentioned above, important results: The patient's  hemoglobin 09/30 was 7.1, increased to 8.5, and now is 7.9 at discharge. White count improved from 2.4 on the 29th up to 13 on the 3rd and platelet count has been around 11,000 on the 29th and increased to 43 after 4 units of platelets transfused and today is 24,000. Creatinine is 3.82 coming down from peak of 5. His troponin was as high as 1.2, trending down.   DISCHARGE MEDICATIONS:  1.  Fosrenol 1000 mg 3 times daily with meals. 2.  Hectorol 0.5 mcg once a day. 3.  Combigan 1 drop at bedtime. 4.  Humulin R sliding scale. 5.  Lantus 20 units subcutaneously at bedtime. 6.  Dexamethasone 20 mg 2 times a week.  7.  Sodium bicarbonate 650 mg twice daily. 8.  Valacyclovir 500 mg once a day. 9.  Zofran 1 every 8 hours as needed for nausea.  10.  Allopurinol 100 mg once a day. Dose was decreased due to kidney failure.  11.  Coreg 3.125 mg 2 times daily.   DISCHARGE FOLLOWUP AND INSTRUCTIONS: With Dr. Candiss Norse, nephrology, in the next 1 to 2 weeks, Dr. Miguel Aschoff, primary care physician, in 1 to 2 weeks, oncology at [facility], Dr. Francesca Jewett. The patient needs a followup CBC and BMP next Monday to be reported to Dr. Francesca Jewett  and Dr. Candiss Norse.   AS PER PROBLEMS: Please refer to interim discharge summary dictated by Dr. Vianne Bulls on September 30. The patient was admitted with a history of chest pain. He has multiple myeloma. He was getting Revlimid at [facility] with prednisone, but he still has a spike on his SPEP due to the multiple myeloma. The case was discussed with oncology and they seem to think that this is secondary to the multiple myeloma not responding to chemotherapy and they suggested palliative care.   Palliative care consultation was done and they suggested hospice. The patient was refusing hospice. At this moment, he is not prepared to do it. He is a DNR though. He does not feel like he is sick enough to need hospice right away.   As far as his acute on chronic kidney failure, his creatinine is now around 3, close to his normal baseline which is around 3 as well. The patient was seen by nephrology and they recommended dialysis. The patient refused dialysis. His GFR actually has improved. Creatinine came down from 5 down to 3 now. The patient is making good urine and he is going to follow up with Dr. Candiss Norse outpatient.   As far as his thrombocytopenia, the patient has significant low blood platelets of 14,000 for what he received 4 units of platelets. The platelets corrected up to 45,000. Today they are 24,000. The patient is going to continue  to have issues with thrombocytopenia and pancytopenia as this is secondary to not just chemotherapy but also the multiple myeloma.   The patient has been given Neupogen. His white count is now 13,000 for what we are going to stop it. The patient could have more Neupogen outpatient as needed. He is being also told that he could have transfusions of platelets and/or blood as necessary if his counts continue to come down. The patient also developed fever during this hospitalization. Chest x-ray showed possible infiltrate, which we diagnosed as pneumonia. Since the patient had severe  thrombocytopenia, he was started on Levaquin. Levaquin will be continued for 2 more doses as outpatient. No more fever. The patient's white blood count is 13,000. No respiratory symptoms.   As far as his systolic heart failure, did not require any major interventions. He did not get Lasix due to his worsening of renal function. He was a little bit fluid overload, but the problem resolved by itself. He was put on Coreg and he was able to tolerate it well for what we are going to continue.   He did have a non-ST-elevation MI, as mentioned above. Dr. Fletcher Anon recommended an echocardiogram showing ejection fraction of 20% to 30%. No anticoagulation or antiplatelet therapy was started as the patient has severe thrombocytopenia. The patient was not able to tolerate that, not able to tolerate statins due to his weakness and he did not want any other medications anyway. The patient is discharged in good condition.   TIME SPENT: About 45 minutes with this discharge.  ____________________________ [place] Sink, MD rsg:sb D: 07/20/2013 10:55:06 ET T: 07/20/2013 11:20:47 ET JOB#: [id number]  cc: [place] Sink, MD, <Dictator> Terrianne Cavness America Brown MD ELECTRONICALLY SIGNED 07/24/2013 23:37

## 2015-02-07 NOTE — Consult Note (Signed)
Brief Consult Note: Diagnosis: Multiple Myeloma.   Comments: Patient with multiple myeloma being treated at [facility]. Extensive marrow involvement per [facility]. On Carfilzomib started 9/24. Now iwth chest pain, neutropenia and renal failure. Would continue neutropenic precuations as well as empiric antibiotic coverage for neutropenia. Transfuse platelets if active bleeding or platelet count < 20 000. Transfuse prbcs if Hb < 8g/dl. Will follow with you Dr Melba Coon informed.  Electronic Signatures: Georges Mouse (MD)  (Signed 26-Sep-14 13:11)  Authored: Brief Consult Note   Last Updated: 26-Sep-14 13:11 by Georges Mouse (MD)

## 2015-02-07 NOTE — H&P (Signed)
PATIENT NAMEJHOVANY, [name] MR#:  [id number] DATE OF BIRTH:  12-Feb-1935  DATE OF ADMISSION:  07/13/2013  REFERRING PHYSICIAN: Dr. Joni Fears.   PRIMARY CARE PHYSICIAN: Dr. Rosanna Randy.   ONCOLOGY: Gaspar Cola.   CHIEF COMPLAINT: Chest pain.   HISTORY OF PRESENT ILLNESS: This is a 80 year old Caucasian gentleman with past medical history of multiple myeloma on active chemotherapy with carfilzomib infusions. He received his second dose on 07/11/2013. He also has type 2 diabetes which is insulin requiring, history of DVT and PE diagnosed in January 2013 who recently stopped warfarin therapy approximately 2 to 3 weeks ago at the advice of his oncologist and starting this new medication. He also has chronic kidney disease. He is presenting with chest pain. He mentions having gradual worsening chest pain located over his left chest which is nonradiating, dull in nature, 6 to 10 in intensity. No relieving factors. Worsened with inspiration. This originally started around 80 on September 25th. He was getting ready to present to the Emergency Department secondary to chest pain and had a syncopal episode while getting into the car. It was unwitnessed. His wife states that she heard him hit the ground but was not present. The patient himself denies knowledge of the event other than waking up on the ground. He had no preceding prodrome. There was associated loss of consciousness lasting a few seconds. No loss of bowel and bladder function. No tongue biting. In the Emergency Department, he was found to have an elevated troponin as well as low platelets. He has been ordered to be given platelet transfusion as well as starting a heparin drip. Currently, he states that his chest pain is still present between 1 and 2 out of 10 in intensity when compared to earlier.   REVIEW OF SYSTEMS:  CONSTITUTIONAL: Denies fevers, fatigue, weakness.  EYES: Denies blurred vision or eye pain.  ENT: Denies ear pain, discharge,  dysphagia.  RESPIRATORY: Denies cough, wheeze, hemoptysis or shortness of breath.  CARDIOVASCULAR: Chest pain as above. Denies any palpitations or edema.  GASTROINTESTINAL: Denies nausea, vomiting, diarrhea, abdominal pain.  GENITOURINARY: Denies hematuria or dysuria.  ENDOCRINE: Denies nocturia or thyroid problems.  HEMATOLOGIC AND LYMPHATIC: Mentions easy bruising as well as bleeding.  SKIN: Denies any rashes. He has multiple areas of ecchymosis.  MUSCULOSKELETAL: Denies any pain in the neck, back, shoulders or knees.   NEUROLOGIC: Denies any paralysis or paresthesias.  PSYCHIATRIC: Denies any anxiety or depressive symptoms.   Otherwise, full review of systems performed by me is negative.   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   SOCIAL HISTORY: Denies any tobacco smoking. Does mention chewing smokeless tobacco. Denies any alcohol usage. He is married and lives with his wife at home.   FAMILY HISTORY: Denies any knowledge of cardiovascular or pulmonary problems. Denies any knowledge of PEs or DVTs.   ALLERGIES: No known drug allergies.   HOME MEDICATIONS: Allopurinol 300 mg p.o. daily, Combigan 0.2/0.5% ophthalmic solution 1 drop to both eyes b.i.d., dexamethasone 4 mg tablets to take 5 tablets or 20 mg by mouth on days 1,4,8 and 11 of each cycle, Fosrenol 1000 mg chewable tablets p.o. t.i.d., Humulin sliding scale, Lantus 20 units subcutaneous injection daily, Hectorol 0.5 mcg p.o. daily.   PHYSICAL EXAMINATION:  VITAL SIGNS: Temperature  98.5 degrees Fahrenheit, heart rate 113, respirations 22, blood pressure 101/69, saturating 97% on supplemental O2, weight 93.4 kg, BMI of 29.6.  GENERAL: Well-nourished, well-developed gentleman currently  in no acute distress.  HEAD: Normocephalic, atraumatic.  EYES: Pupils equal, round, reactive to light. Extraocular muscles intact. No icterus.  MOUTH: Moist mucosal membranes. Dentition intact. No abscess noted.  EAR, NOSE AND THROAT: Throat is clear without exudate. No external lesions.  NECK: Supple. No thyromegaly or nodules noted. No JVD.  CARDIOVASCULAR: S1, S2, tachycardic. No murmurs, rubs or gallops noted. No edema. Pedal pulses 2+ bilaterally.  PULMONARY: Clear to auscultation bilaterally without wheezes, rubs or rhonchi. No use of accessory muscles. Good effort.  CHEST: Nontender to palpation.  GASTROINTESTINAL: Soft, nontender, nondistended. No masses. No hepatosplenomegaly. Positive bowel sounds.  MUSCULOSKELETAL: No swelling, clubbing or edema. Range of motion is full in all extremities.  NEUROLOGIC: Cranial nerves II through XII intact. No gross focal neurological deficits. Sensation intact. Reflexes intact.  SKIN: No ulcerations. Multiple areas of ecchymosis involve the upper and lower extremities. No rashes. No cyanosis. Skin is warm and dry. Turgor is intact.  PSYCHIATRIC: Mood and affect within normal limits. He is alert and oriented x 3. Insight and judgment intact.   LABORATORY DATA: Sodium 135, potassium 4.4, chloride 106. Bicarb 16. Anion gap of 13. BUN 72, creatinine 4.85, glucose 284. Total protein 6.8, albumin 3.3, total bilirubin 0.3, alk phos 75, AST 78, ALT 69. Troponin I 1.30. WBC 1.3, hemoglobin 10.2, platelets of 19. PT 14.8, INR 1.1, PTT 116.3. Lactic acid of 4.5. EKG: Sinus tachycardia at 129. Left bundle branch block. Chest x-ray: He has a port in place in the left chest which terminates in the SVC. Otherwise, no active cardiopulmonary process.   ASSESSMENT AND PLAN: A 80 year old gentleman with history of multiple myeloma on active chemotherapy with carfilzomib. Received his second dose yesterday on the 24th. He also has history of deep vein thrombosis and  pulmonary embolus back in January 2013 but it not currently on anticoagulation. Presenting with chest pain.  1. Pleuritic chest pain with Wells score of 7, placing him at high risk for pulmonary embolus. He is unable to get a CT angiogram secondary to renal function. Ordered a V/Q scan for the morning as this is a likely diagnosis. Will start heparin drip and monitor his platelets. Daily transfuses until greater than 50,000. Other possible etiologies include cardiovascular versus carfilzomib infusion. Will trend cardiac enzymes q.8 hours. Check a lipid panel and give statin now.  2. Multiple myeloma, on carfilzomib treatment: Consult oncology. Order dexamethasone per outpatient schedule. Continue his home dose of allopurinol.  3. Thrombocytopenia: Transfuse platelets to goal greater than 50,000.  4. Lactic acidosis: Intravenous fluid hydration with normal saline and recheck in the morning.  5. Anion gap metabolic acidosis in the setting of uremia and lactic acidosis: Intravenous fluid hydration with normal saline.  6. Type 2 diabetes: Insulin sliding scale and Lantus.   He is a FULL CODE.   TIME SPENT: 55 minutes.   ____________________________ Aaron Mose. Hower, MD dkh:gb D: 07/13/2013 02:31:20 ET T: 07/13/2013 02:52:13 ET JOB#: [id number]  cc: Aaron Mose. Hower, MD, <Dictator> DAVID Woodfin Ganja MD ELECTRONICALLY SIGNED 07/14/2013 21:53

## 2015-02-07 NOTE — Consult Note (Signed)
PATIENT NAMEGRAINGER, [name] MR#:  [id number] DATE OF BIRTH:  10-09-1935  DATE OF CONSULTATION:  07/13/2013  REFERRING PHYSICIAN:  Epifanio Lesches, MD CONSULTING PHYSICIAN:  Sammantha Mehlhaff A. Fletcher Anon, MD PRIMARY CARE PHYSICIAN:  Richard L. Rosanna Randy, MD  HISTORY OF PRESENT ILLNESS: This is an unfortunate 80 year old male with known history of refractory multiple myeloma on active chemotherapy, advanced chronic kidney disease and chronic left bundle branch block. He also has history of type 2 diabetes with previous DVT and pulmonary embolism in January 2013. He was on warfarin in the past, but this was discontinued a few weeks ago due to chemotherapy and pancytopenia. He presented to the Emergency Room with left-sided chest achiness with no radiation. It was worse with inspiration. There was no significant tightness feeling. There was a possible syncopal episode on the way to the Emergency Room. He was found to have mildly elevated troponin with normal CK-MB. He is currently chest pain-free. He is significantly pancytopenic.   PAST MEDICAL HISTORY: 1.  Refractory multiple myeloma on active chemotherapy with pancytopenia.  2.  Advanced chronic kidney disease.  3.  Type 2 diabetes.  4.  History of DVT and pulmonary embolism in January 2013.  5.  Unspecified congestive heart failure.   SOCIAL HISTORY: Negative for smoking, alcohol or drug use.   FAMILY HISTORY: Negative for premature coronary artery disease.   ALLERGIES: No known drug allergies.   HOME MEDICATIONS: Were reviewed.   REVIEW OF SYSTEMS: A 10-point review of systems was performed. It is negative other than what is mentioned in the HPI.   PHYSICAL EXAMINATION: GENERAL: The patient appears to be at his stated age, in no acute distress.  VITAL SIGNS: Temperature is 99.4, heart rate is 101, respiratory rate is 18, blood pressure is 102/64, and oxygen saturation is 99% on 2 liters nasal cannula.  HEENT: Normocephalic, atraumatic.  NECK:  No JVD or carotid bruits.  RESPIRATORY: Normal respiratory effort with no use of accessory muscles. Auscultation reveals diminished breath sounds at the base.  CARDIOVASCULAR: Normal PMI. Normal S1 and S2 with no gallops or murmurs.  ABDOMEN: Benign, nontender and nondistended.  EXTREMITIES: Trace edema bilaterally.  SKIN: He has extensive bruising.  PSYCHIATRIC: He is alert, oriented x 3, with normal mood and affect.   LABORATORY AND DIAGNOSTIC DATA: ECG showed sinus tachycardia with left bundle branch block. Labs showed a creatinine of 4.85. Troponin was 1.30, which subsequently decreased to 0.85. CK-MB was 3.2. White cell count was 0.8 with a hemoglobin of 8.3 and platelet count of 14,000. His lactic acid was elevated on presentation.   IMPRESSION: 1.  Non-ST elevation myocardial infarction.  2.  Refractory multiple myeloma with severe pancytopenia.  3.  Advanced chronic kidney disease.  4.  Old  left bundle branch block.   RECOMMENDATIONS: It is not entirely clear if this is a true myocardial infarction or supply-demand ischemia. He was tachycardic on presentation. There was suspicion of pulmonary embolism. However, V/Q scan was overall low probability. Unfortunately, his management options are somewhat limited due to refractory multiple myeloma with pancytopenia and advanced chronic kidney disease. I agree with no anticoagulation, which is contraindicated with severe thrombocytopenia. His echocardiogram showed moderately to severely reduced left ventricular systolic function with an ejection fraction of 30% to 35%. I recommend starting small-dose carvedilol if tolerated by blood pressure. No angiotensin-converting enzyme inhibitor is advised due to chronic kidney disease. He is already on atorvastatin. He will be treated medically for presumed coronary artery disease  as well as congestive heart failure. His overall prognosis is unfortunately poor. Consider palliative care consult.    ____________________________ Mertie Clause. Fletcher Anon, MD maa:jm D: 07/13/2013 18:26:35 ET T: 07/13/2013 18:57:08 ET JOB#: [id number]  cc: Rogue Jury A. Fletcher Anon, MD, <Dictator> Epifanio Lesches, MD Richard L. Rosanna Randy, MD Rogue Jury Ferne Reus MD ELECTRONICALLY SIGNED 07/30/2013 14:01

## 2015-02-09 NOTE — H&P (Signed)
PATIENT NAMEKAMARE, [name] MR#:  [id number] DATE OF BIRTH:  Sep 15, 1935  DATE OF ADMISSION:  11/03/2011  PRIMARY CARE PHYSICIAN: [name]  ONCOLOGIST: [name] at [place]: [name]  REQUESTING PHYSICIAN: [name]   CHIEF COMPLAINT: Right shoulder pain.   HISTORY OF PRESENT ILLNESS: Patient is a 80 year old male with a known history of multiple myeloma, transitional cell bladder cancer is being admitted for PE. Patient was at [facility] yesterday for low platelet count for regular follow up and he was given Nplate which is to keep his platelet count stable while on Revlimid and dexamethasone for treatment of multiple myeloma. He started having right shoulder pain which was extending down into the right pectoralis muscle area along with epigastric region. It started last night. He did not have any associated shortness of breath, nausea, vomiting, or diaphoresis. When he tries to lay down pain gets worse, it is 10/10 in severity at times. He denies any other symptoms at this time but still having chest pain on deep breathing.   PAST MEDICAL HISTORY:  1. Multiple myeloma on Revlimid and dexamethasone.  2. Diabetes.  3. 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  4. Basal cell carcinoma status post Mohs resection.  5. Benign prostatic hypertrophy.  6. Hypertension.   PAST SURGICAL HISTORY:  1. Transurethral resection of bladder tumor by [name].  2. Local skin cancer excision.  3. Nasal surgery.  4. Mohs resection for basal cell cancer cystoscopy. 5. Every three months by [name].   ALLERGIES: No known drug allergies.   SOCIAL HISTORY: No smoking. No alcohol.   FAMILY HISTORY: Father had heart disease.   MEDICATIONS AT HOME:  1. Revlimid 10 mg every other day.  2. Fosrenol 1000 mg p.o. t.i.d. with  meals.  3. Magnesium 400 mg p.o. b.i.d.  4. Dexamethasone 4 mg 5 tablets p.o. once a week on Friday.  5. Insulin Lantus 25 units subcutaneous at bedtime, except on Friday when he takes 35 units due to dexamethasone.  6. Humulin-R sliding scale insulin. 7. Latanoprost 0.005% ophthalmic solution one drop to each eye at bedtime.  8. [name] 0.5 mg once a day.   REVIEW OF SYSTEMS: CONSTITUTIONAL: No fever, fatigue, weakness. EYES: No blurry or double vision. ENT: No tinnitus or ear pain. RESPIRATORY: No cough, wheezing, hemoptysis. CARDIOVASCULAR: Positive for radiating pain from right shoulder. No orthopnea, edema. GASTROINTESTINAL: No nausea, vomiting, diarrhea. GENITOURINARY: No dysuria, hematuria. History of bladder tumor and getting cystoscopy every three months with [name]. ENDOCRINE: No polyuria, nocturia. HEMATOLOGY: History of multiple myeloma, followed by [facility] oncology. SKIN: No rash or lesion. MUSCULOSKELETAL: No arthritis or muscle cramps. NEUROLOGIC: No tingling, numbness, weakness. PSYCHIATRIC: No history of anxiety, depression.   PHYSICAL EXAMINATION:  VITAL SIGNS: Temperature 97.1, heart rate 97 per minute, respirations 22 per minute, blood pressure 146/70 mmHg. He is saturating 96% on room air.   GENERAL: Patient is a 80 year old male lying in the bed comfortably without any acute distress.   EYES: Pupils round, reactive to light, accommodation. No scleral icterus. Extraocular muscles intact.   HENT: Head atraumatic, normocephalic. Oropharynx and nasopharynx clear.   NECK: Supple. No jugular venous distention. No thyroid enlargement of tenderness.   LUNGS: Clear to auscultation bilaterally. No wheezing, rales, rhonchi, crepitation.   CARDIOVASCULAR: S1, S2 normal. No murmur, rubs,  or gallop.   ABDOMEN: Soft, nontender, nondistended. Bowel sounds present. No organomegaly or mass.   EXTREMITIES: No pedal edema, cyanosis, clubbing.   NEUROLOGIC: Nonfocal examination.  Cranial nerves III through XII intact. Muscle strength 5/5 in all extremities. Sensation intact.   PSYCH: Patient is oriented to time, place, and person x3.   SKIN: No obvious rash, lesion, ulcer.   LABORATORY, DIAGNOSTIC AND RADIOLOGICAL DATA: Normal BMP except BUN 58, creatinine 4.02. Normal first set of cardiac enzymes. Normal CBC except hemoglobin of 10.4, platelets 49, hematocrit 31.4.   PTT 29.2. D-dimer more than 6.   Chest x-ray while in the Emergency Department shows no acute cardiopulmonary disease. Basilar atelectasis. V/Q scan showed two large segmental perfusion ventilation mismatch defect consistent with high probability of PE.   EKG shows normal sinus rhythm, left bundle branch block. This was unchanged from previous EKG in December.   IMPRESSION AND PLAN:  1. Suspected pulmonary embolus. Will start him on heparin drip with bolus with bleeding precautions. Can start Coumadin also, his platelet counts were stable. Will get oncology consult (discussed with [name]). Also discussed with his hematology doctor at [facility], [name] who is in agreement. Revlimid and dexamethasone at this time. Will consult vascular surgery to see if he needs any further intervention. Will obtain lower extremity Doppler's to rule out any deep vein thrombosis and/or need for IVC filter. Will also obtain 2-D echocardiogram to see any right heart strain.  2. Thrombocytopenia. Patient has been getting Nplate at [facility] due to his ongoing thrombocytopenia. As per [name] patient's platelet count has been steady between 50s to 70s with Nplate which he has been getting once a week and his platelet counts are mainly dropping because of Revlimid and dexamethasone which is a treatment for multiple myeloma. Will hold off that for now. Consult [name].  3. Chronic kidney disease, stage IV. Will consult nephrology. GFR is around 16 to 17. This seemed to be at his baseline. Will consult nephrology for further  evaluation and management. Will avoid any nephrotoxins.  4. Diabetes. Will continue home medication, add sliding scale insulin and monitor his blood sugar.  5. CODE STATUS: FULL CODE.  I have discussed the case with his oncology at [facility], [name] who is in agreement with management plan. I also discussed the case with patient and his family along with [name] who agrees with management. I also requested him to get palliative care consult which he refused.   TOTAL TIME TAKING CARE OF THIS PATIENT (CRITICAL CARE): 55 minutes.   Patient remains at very high risk for cardiopulmonary arrest secondary to his underlying multiple medical problems and large pulmonary embolism along with severe thrombocytopenia.   ____________________________ Lucina Mellow. Manuella Ghazi, MD vss:cms D: 11/03/2011 10:10:50 ET T: 11/03/2011 10:36:11 ET JOB#: [id number]  cc: Wandalee Klang S. Manuella Ghazi, MD, <Dictator> Richard L. Rosanna Randy, MD [name] Dr. Isabella Stalling K. Oliva Bustard, MD Remer Macho MD ELECTRONICALLY SIGNED 11/04/2011 10:54

## 2015-02-09 NOTE — Consult Note (Signed)
PATIENT NAMEFROYLAN, [name] MR#:  [id number] DATE OF BIRTH:  12-23-1934  DATE OF CONSULTATION:  11/03/2011  REFERRING PHYSICIAN:  [facility] CONSULTING PHYSICIAN:  Algernon Huxley, MD  REASON FOR CONSULTATION: Consideration for IVC filter placement.   HISTORY OF PRESENT ILLNESS: The patient is a 80 year old white male who has advanced multiple myeloma and stage IV chronic kidney disease. He is admitted with chest pain and shortness of breath and found to have pulmonary embolus and extensive right lower extremity deep vein thrombosis. Due to his myeloma, his platelet count is in the 40s, and he is not felt to be a good candidate for long-term anticoagulation. For this reason, we are consulted for evaluation for IVC filter placement. He is also going to be at higher risk of thromboembolic problems given his active malignancy. The chest pain is a bit better today than it was last night. He also complained of  right shoulder pain, and he received Nplate. He was at [facility] yesterday for his low platelet count and received this treatment.   PAST MEDICAL HISTORY:  1. Multiple myeloma.  2. Transitional cell bladder cancer with bladder tumor removal in 2007.  3. Benign prostatic hypertrophy.  4. Hypertension.  5. Diabetes.   PAST SURGICAL HISTORY:  1. Transurethral resection of prostate by Dr. Collins Scotland in 2007.  2. Skin cancer surgery.  3. Nasal surgery.   ALLERGIES: No known drug allergies.   HOME MEDICATIONS:  1. Revlimid 10 mg q.o.d.  2. Fosrenol 1000 mg t.i.d.  3. Magnesium 400 mg b.i.d.  4. Dexamethasone 20 mg weekly.  5. Insulin Lantus 25 units at night and 35 units on Friday.  6. Sliding scale insulin.  7. Latanoprost ophthalmic solution.  8. Hectorol 0.5 mg daily.   SOCIAL HISTORY: No current alcohol or tobacco abuse.   FAMILY HISTORY: Father had heart disease.   REVIEW OF SYSTEMS: GENERAL:  No fevers or chills. No intentional weight loss or gain. EYES: No blurred or double vision. EARS:  No tinnitus or ear pain. CARDIAC: Radiating chain from the right shoulder to the chest. RESPIRATORY: Some shortness of breath with deep inspiration and chest pain. GASTROINTESTINAL: No nausea, vomiting, diarrhea. GENITOURINARY: No dysuria or hematuria. History of extensive bladder issues. ENDOCRINE: No heat or cold intolerance. HEMATOLOGIC:  Positive for multiple myeloma and thrombocytopenia. SKIN: No new rashes or ulcers. MUSCULOSKELETAL: No arthritis or cramp. NEUROLOGIC: No transient ischemic attack, stroke or seizure. PSYCHIATRIC: No anxiety or depression.   PHYSICAL EXAMINATION:  GENERAL: The patient is a pleasant white male lying in his bed, not in apparent distress, admitted earlier this morning.   VITAL SIGNS: On admission, temperature 97.1, heart rate 97, respirations 22, saturations 96%, blood pressure 146/70.   HEENT: Eyes: Sclerae are anicteric. Conjunctivae are clear. HEAD: Normocephalic and atraumatic.   NECK: Supple without adenopathy or jugular venous distention.    HEART: Regular rate and rhythm without murmurs.   LUNGS: Clear bilaterally.   ABDOMEN: Soft, nondistended, nontender.   EXTREMITIES: No significant cyanosis, clubbing. He may have some mild right lower extremity edema.   NEUROLOGICAL: Normal strength and tone in all four extremities.   PSYCHIATRIC: Normal affect and mood.   SKIN: No new rash or ulcers.   LABORATORY DATA:  Sodium 143, potassium 4.0, chloride 105, CO2 23, BUN is 58, creatinine 4.0, glucose 200. White blood cell count 8.7, hemoglobin 10.4, platelet count 49,000.   ASSESSMENT AND PLAN: The patient  a 80 year old white male with multiple myeloma, severe chronic  kidney disease. He has got acute deep venous thrombosis which is extensive in the right lower extremity encompassing all visualized veins and an active pulmonary embolus.  I would agree that given his situation long-term anticoagulation will be quite difficult, and IVC filter would be  reasonable, particularly given his prothrombotic nature with his malignancy and extensive residual DVT . As such, we have offered an IVC filter. He would like to talk this over with his family, which is reasonable. We will be happy to perform this today or tomorrow if he decides to have this done.   This is a level-4 consultation.  ____________________________ Algernon Huxley, MD jsd:cbb D: 11/18/2011 14:43:34 ET T: 11/18/2011 17:57:06 ET JOB#: [id number]  cc: Algernon Huxley, MD, <Dictator> Algernon Huxley MD ELECTRONICALLY SIGNED 12/01/2011 11:14

## 2015-02-09 NOTE — Op Note (Signed)
PATIENT [name]:  Stuhr, Rion C MR#:  [id number] DATE OF BIRTH:  04-03-1935  DATE OF PROCEDURE:  10/07/2011  PREOPERATIVE DIAGNOSIS:  Large left temple and left lateral canthal eyelid defect from Mohs surgery (6 x 5 cm).   POSTOPERATIVE DIAGNOSIS: Large left temple and left lateral canthal eyelid defect from Mohs surgery (6 x 5 cm).   PROCEDURES:  1. Left temple advancement rotation flap. 2. Left lower eyelid advancement rotation flap.   SURGEON: Zackery BarefootJ. Madison Miyako Oelke, MD  DESCRIPTION OF PROCEDURE: The patient was placed in supine position on the Operating Room table. After general endotracheal anesthesia had been induced, patient was turned 90 degrees clockwise from anesthesia, locally anesthetized, prepped and draped in the usual fashion after removal of the dressing. The surgical marking pen was used to create a surgical plan including a large O to T type advancement rotation flap. The flap was undermined in the subdermal plane and advanced and rotated into position after reapproximating the SMAS through imbrication using 3-0 Vicryl sutures. The flaps were then secured with 4-0 Vicryl suture in an interrupted fashion with Burroughs triangles taken out x3 in the lateral portion of the flap. The eyelid advancement rotation flap was then created with a triangular backcut in the upper eyelid and further backcut in the lower eyelid. These flaps were secured with 4-0 Vicryl and skin was closed with 6-0 nylon and 5-0 nylon. The wound was dressed with iced gauze. The patient was returned to anesthesia, allowed to emerge from anesthesia in the Operating Room, taken to the recovery room in stable condition. No complications. Estimated blood loss 25 mL.   ____________________________ J. Gertie BaronMadison Sione Baumgarten, MD jmc:cms D: 10/07/2011 22:11:13 ET T: 10/08/2011 10:18:58 ET JOB#: [id number]  cc: Zackery BarefootJ. Madison Fotios Amos, MD, <Dictator> Wendee CoppJMADISON Alayssa Flinchum MD ELECTRONICALLY SIGNED 10/19/2011 10:57

## 2015-02-09 NOTE — Consult Note (Signed)
Patient with advanced multiple myeloma, stage IV CKD, admitted with chest pain and SOB.  Has PE and extensive RLE DVT.  Has Platelet count in the 40s making long term anti-coagulation difficult and with his malignancy is at very high risk of further thromboemolic problems.  IVC filter is reasonable and offered.  He will consider this and if he decides to proceed will do this afternoon or tomorrow am.  Electronic Signatures: Algernon Huxley (MD)  (Signed on 16-Jan-13 11:59)  Authored  Last Updated: 16-Jan-13 11:59 by Algernon Huxley (MD)

## 2015-02-09 NOTE — Consult Note (Signed)
Patient seen, please see dictation for more details. Briefly, patient is a 76/male with known h/o multiple myeloma and chronic kidney disease stage IV getting treatment at [facility] by Dr.Voorhees. Per patient and family, he has been on Revlimid for past few months, and most recent eval at [facility] was showing progression of myeloma. Will need to get records from Dr.Voorhees. Currently admitted with pulmonary embolus and lower extremity DVT, CBC shows WBC 8700, Hb 10.4, platelets 49K, Cr 4.02. Thrombocytopenia most likely secondary to Revlimid effect. Agree with current treatment plan to antociogulate with unfractionated Heparin IV, monitor daily CBC and watch for bleeding Sxs, Vascular surgeon consult for IVC filter placement. Hold Revlimid for now, further myeloma management can be planned once acute issues improve. Will request his previous oncologist Dr.Finnegan to continue to follow starting tomorrow.   Electronic Signatures: Jonn Shingles (MD)  (Signed on 16-Jan-13 23:26)  Authored  Last Updated: 16-Jan-13 23:26 by Jonn Shingles (MD)

## 2015-02-09 NOTE — Op Note (Signed)
PATIENT NAMEALVINO, [name] MR#:  [id number] DATE OF BIRTH:  Oct 23, 1934  DATE OF PROCEDURE:  11/04/2011  PREOPERATIVE DIAGNOSES:  1. Extensive right lower extremity deep venous thrombosis and pulmonary embolus with low platelet count.  2. Multiple myeloma.  3. Chronic kidney disease.   POSTOPERATIVE DIAGNOSES: 1. Extensive right lower extremity deep venous thrombosis and pulmonary embolus with low platelet count.  2. Multiple myeloma.  3. Chronic kidney disease.   PROCEDURES: 1. Ultrasound guidance for vascular access, left femoral vein.  2. Catheter placement into inferior vena cava.  3. Inferior venacavogram and left iliac venogram.  4. Placement of a Bard Meridian IVC filter.   SURGEON: Algernon Huxley, M.D.   ANESTHESIA: Local.  BLOOD LOSS: Minimal.  FLUOROSCOPY TIME: Less than one minute. CONTRAST USED: 20 mL.   INDICATION FOR PROCEDURE: This is a 80 year old white male with extensive right lower extremity deep vein thrombosis, pulmonary embolus and a platelet count in the 40s. He is going to be anticoagulated currently, but will likely not be a candidate for long-term anticoagulation. He also has extensive clot burden in the right lower extremity with large piece already present. For these reasons, IVC filter is requested and felt to be reasonable. Risks and benefits were discussed. Informed consent was obtained.   DESCRIPTION OF PROCEDURE: The patient is brought to the vascular interventional radiology suite. Groin was shaved and prepped and a sterile surgical field was created. The left femoral vein was visualized with ultrasound and found to be patent. It was then accessed under direct ultrasound guidance without difficulty with a Seldinger needle. A three J-wire was placed. Initially, the wire did not traverse easily and a venogram through the sheath was performed. The iliac vein was a bit tortuous, but patent and using this guidance, was able to pass wire without difficulty into  the inferior vena cava. An inferior venacavogram was then performed with the level of the renal veins being at L1 and a Bard Meridian IVC filter was then deployed at the level of L2 below the renal veins. The delivery sheath was removed. Pressure was held. Sterile dressing was placed. The patient tolerated the procedure well and was taken to the recovery room in stable condition.   ____________________________ Algernon Huxley, MD jsd:ap D: 11/04/2011 16:53:29 ET T: 11/04/2011 17:45:25 ET JOB#: [id number]  cc: Algernon Huxley, MD, <Dictator> Algernon Huxley MD ELECTRONICALLY SIGNED 12/01/2011 11:13

## 2015-02-09 NOTE — Op Note (Signed)
PATIENT [name], [name] MR#:  [id number] DATE OF BIRTH:  31-Jul-1935  DATE OF PROCEDURE:  11/29/2011  PREOPERATIVE DIAGNOSIS:  Cataract, left eye.  POSTOPERATIVE DIAGNOSIS:  Cataract, left eye.  PROCEDURE PERFORMED:  Extracapsular cataract extraction using phacoemulsification with placement of an Alcon SN6CWS 16.5-diopter posterior chamber lens, serial # [id number].  SURGEON:  Maylon PeppersSteven A. Milam Allbaugh, MD  ASSISTANT:  None.  ANESTHESIA:  4% lidocaine and 0.75% Marcaine in a 50/50 mixture with 10 units/mL of Hylenex added, given as a peribulbar.  ANESTHESIOLOGIST:  Dr. Dimple Caseyice.   COMPLICATIONS:  None.  ESTIMATED BLOOD LOSS:  Less than 1 mL.  DESCRIPTION OF PROCEDURE:  The patient was brought to the operating room and given a peribulbar block.  The patient was then prepped and draped in the usual fashion.  The vertical rectus muscles were imbricated using 5-0 silk sutures.  These sutures were then clamped to the sterile drapes as bridle sutures.  A limbal peritomy was performed extending two clock hours and hemostasis was obtained with cautery.  A partial thickness scleral groove was made at the surgical limbus and dissected anteriorly in a lamellar dissection using an Alcon crescent knife.  The anterior chamber was entered supero-temporally with a Superblade and through the lamellar dissection with a 2.6 mm keratome.  DisCoVisc was used to replace the aqueous and a continuous tear capsulorrhexis was carried out.  Hydrodissection and hydrodelineation were carried out with balanced salt and a 27 gauge canula.  The nucleus was rotated to confirm the effectiveness of the hydrodissection.  Phacoemulsification was carried out using a divide-and-conquer technique.  Total ultrasound time was 1 minute and 46 seconds with an average power of 19.8 percent.  Irrigation/aspiration was used to remove the residual cortex.  DisCoVisc was used to inflate the capsule and the internal incision was enlarged to  3 mm with the crescent knife.  The intraocular lens was folded and inserted into the capsular bag using the AcrySert delivery system.  Irrigation/aspiration was used to remove the residual DisCoVisc.  Miostat was injected into the anterior chamber through the paracentesis track to inflate the anterior chamber and induce miosis.  The wound was checked for leaks and wound leakage was found.  A single 10-0 suture was placed across the incision, tied and the knot was rotated superiorly.  The conjunctiva was closed with cautery and the bridle sutures were removed.  Two drops of 0.3% Vigamox were placed on the eye.   An eye shield was placed on the eye.  The patient was discharged to the recovery room in good condition.  ____________________________ Maylon PeppersSteven A. Caylie Sandquist, MD sad:cms D: 11/29/2011 13:07:04 ET T: 11/29/2011 13:49:34 ET JOB#: [id number]  cc: Viviann SpareSteven A. Merle Cirelli, MD, <Dictator> Erline LevineSTEVEN A Keisi Eckford MD ELECTRONICALLY SIGNED 12/06/2011 13:31

## 2015-02-09 NOTE — Op Note (Signed)
PATIENT [name]:  [name], [name] MR#:  [id number] DATE OF BIRTH:  1935-02-17  DATE OF PROCEDURE:  12/27/2011  PREOPERATIVE DIAGNOSIS:  Cataract, right eye.   POSTOPERATIVE DIAGNOSIS:  Cataract, right eye.  PROCEDURE PERFORMED:  Extracapsular cataract extraction using phacoemulsification with placement of an Alcon SN6CWS, 15.0-diopter posterior chamber lens, serial # [id number].  SURGEON:  Maylon PeppersSteven A. Brayden Betters, MD  ASSISTANT:  None.  ANESTHESIA:  4% lidocaine and 0.75% Marcaine in a 50/50 mixture with 10 units per mL of Hylenex added, given as peribulbar.  ANESTHESIOLOGIST:  Dr. Henrene HawkingKephart   COMPLICATIONS:  None.  ESTIMATED BLOOD LOSS:  Less than 1 mL.  DESCRIPTION OF PROCEDURE:  The patient was brought to the operating room and given a peribulbar block.  The patient was then prepped and draped in the usual fashion.  The vertical rectus muscles were imbricated using 5-0 silk sutures.  These sutures were then clamped to the sterile drapes as bridle sutures.  A limbal peritomy was performed extending two clock hours and hemostasis was obtained with cautery.  A partial thickness scleral groove was made at the surgical limbus and dissected anteriorly in a lamellar dissection using an Alcon crescent knife.  The anterior chamber was entered superonasally with a Superblade and through the lamellar dissection with a 2.6 mm keratome.  DisCoVisc was used to replace the aqueous and a continuous tear capsulorrhexis was carried out.  Hydrodissection and hydrodelineation were carried out with balanced salt and a 27 gauge canula.  The nucleus was rotated to confirm the effectiveness of the hydrodissection.  Phacoemulsification was carried out using a divide-and-conquer technique.  Total ultrasound time was 1 minute and 47 seconds with an average power of 18.6 percent, CDE 32.01.  Irrigation/aspiration was used to remove the residual cortex.  DisCoVisc was used to inflate the capsule and the internal incision  was enlarged to 3 mm with the crescent knife.  The intraocular lens was folded and inserted into the capsular bag using the AcrySert delivery system.  Irrigation/aspiration was used to remove the residual DisCoVisc.  Miostat was injected into the anterior chamber through the paracentesis track to inflate the anterior chamber and induce miosis.  The wound was checked for leaks and none were found. The conjunctiva was closed with cautery and the bridle sutures were removed.  Two drops of 0.3% Vigamox were placed on the eye.   An eye shield was placed on the eye.  The patient was discharged to the recovery room in good condition.  ____________________________ Maylon PeppersSteven A. Rakhi Romagnoli, MD sad:drc D: 12/27/2011 13:30:18 ET T: 12/27/2011 13:42:09 ET JOB#: [id number]  cc: Viviann SpareSteven A. Jacquan Savas, MD, <Dictator> Erline LevineSTEVEN A Aastha Dayley MD ELECTRONICALLY SIGNED 01/03/2012 13:23

## 2015-02-09 NOTE — Consult Note (Signed)
PATIENT NAMETAVIS, [name] MR#:  [id number] DATE OF BIRTH:  [birth year]  DATE OF CONSULTATION:  11/03/2011  REFERRING PHYSICIAN:  Max Sane, MD CONSULTING PHYSICIAN:  Srija Southard R. Ma Hillock, MD  REASON FOR CONSULTATION: Multiple myeloma, bladder cancer, now with pulmonary embolus.   HISTORY OF PRESENT ILLNESS: The patient is a 80 year old gentleman who has been admitted to the hospital earlier today after he presented with complaints of right shoulder pain along with chest pain on deep breathing. The patient was evaluated and had V/Q scan which showed two large segmental perfusion ventilation mismatch defects consistent with high probability of PE. The patient also had lower extremity Doppler which did show nonocclusive thrombus in the right lower extremity deep venous system, left lower extremity was negative for deep vein thrombosis. The patient has known history of multiple myeloma diagnosed about 2 to 3 years ago, he has been evaluated by Dr. Grayland Ormond here in 2010. He states that he was transferred to [facility] for a procedure (likely plasmapheresis) at that time and then decided to stay there for continued myeloma treatment. He states that he initially was treated with combination regimen including Cytoxan, Velcade, and Decadron and had partial response. More recently he has been on low dose Revlimid for the past four months or so. The patient and family states that most recent evaluation by Dr. Melba Coon at [facility] indicated that multiple myeloma was progressing and further treatment planning was being made. He continues to have chronic significant weakness and fatigue. The patient also is status post TURBT mitomycin instillation in December 2012 for high-grade papillary urothelial carcinoma, pathology report did not show any invasion of lamina propria or muscle invasion. Currently he denies any hematuria or progressive abdominal/pelvic pain. He states that appetite and weight have remained steady. CBC  upon admission showed hemoglobin 10.4, WBC 8700, and platelets low at 49,000. The patient denies any other obvious bleeding symptoms either.   PAST MEDICAL HISTORY/PAST SURGICAL HISTORY:  1. Multiple myeloma, as described above.  2. Renal insufficiency, likely related to myeloma, creatinine 11/02/2011 was 4.02.  3. Diabetes mellitus.  4. 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  5. Benign prostatic hypertrophy.  6. Hypertension.  7. Basal cell carcinoma, status post Mohs resection.  8. Nasal surgery.   FAMILY HISTORY: Noncontributory.   SOCIAL HISTORY: He denies smoking or alcohol usage. He lives with his family.   ALLERGIES: No known drug allergies.   HOME MEDICATIONS:  1. Magnesium 400 mg twice a day. 2. Fosrenol 1000 mg p.o. three times daily with meals.  3. Revlimid 10 mg every other day.  4. Decadron 20 mg every week, on Fridays.  5. Lantus insulin 25 units subcutaneous at bedtime, except Friday he takes 35 units due to Decadron dose.  6. Humulin R sliding scale insulin.  7. Hectorol 0.5 mg once daily. 8. Latanoprost 0.005% eye drops one drop to each eye at bedtime.   REVIEW OF SYSTEMS: CONSTITUTIONAL: Has generalized weakness and fatigue. No fevers, chills, or night sweats. HEENT: Currently denies headaches, dizziness, epistaxis, ear or jaw pain. CARDIAC: Has atypical chest pain, likely from PE. No orthopnea or paroxysmal nocturnal dyspnea. No palpitations. LUNGS: Currently has some dyspnea on activity, on nasal cannula oxygen. No new cough, hemoptysis. No wheezing. GASTROINTESTINAL: Denies nausea, vomiting, diarrhea, or bright red blood in stools or melena. GENITOURINARY: No dysuria or hematuria. MUSCULOSKELETAL: Denies any new joint pains or bone pains. SKIN: No new  rashes or pruritus. HEMATOLOGIC: No obvious bleeding symptoms except minor easy skin bruising. ENDOCRINE: No polyuria or polydipsia, appetite is steady.  NEURO: Denies any recent focal weakness, loss of consciousness, or seizures.   PHYSICAL EXAMINATION:   GENERAL: The patient is elderly, resting in bed, moderately built thin individual, alert and oriented to self, place, person, and time and converses appropriately. No acute distress. No icterus. Pallor present.   VITAL SIGNS: Temperature 98.6, pulse 88, respiratory rate 18, blood pressure 119/70, and saturation 98% on room air.   HEENT: Normocephalic, atraumatic. Extraocular movements intact. Sclera anicteric. No oral thrush.   NECK: Supple without lymphadenopathy.   HEART: S1 and S2, regular rate and rhythm.  LUNGS: Bilateral good air entry, no crepitations or rhonchi.  ABDOMEN: Soft, nontender. No hepatosplenomegaly clinically.   EXTREMITIES: No major edema or cyanosis.   SKIN: Minor bruising. No generalized rashes or ecchymosis.   NEUROLOGIC: Cranial nerves seem intact. Moves all extremities spontaneously.   MUSCULOSKELETAL: No obvious joint deformity or swelling.   LABS/STUDIES: As in the History of Present Illness. In addition PTT is 112.5 and cardiac enzymes negative. Chest x-ray showed basilar atelectasis bilaterally. CBC as in History of Present Illness. Calcium 8.9. D-dimer greater than 6.0.   IMPRESSION AND RECOMMENDATIONS: The patient is a 80 year old gentleman with known history of multiple medical problems as detailed above including multiple myeloma and chronic kidney disease stage IV. The patient has been on treatment for multiple myeloma at [facility] by Dr. Melba Coon, and most recently has been on low dose Revlimid for the past few months, reportedly most recent evaluation at [facility] was showing progression of myeloma. We will need to obtain these records from Dr. Melba Coon for review. The patient is currently admitted with pulmonary embolism and right lower extremity deep venous thrombosis, possible risk factors include decreased level of physical activity, ongoing  Revlimid therapy, and  multiple myeloma. The patient also has thrombocytopenia with platelet count of [address], no bleeding symptoms clinically. He has been started on unfractionated IV heparin. Agree with current treatment plan to continue to anticoagulate with unfractionated IV heparin with close APTT monitoring and watch closely for any bleeding symptoms, consult vascular surgeon for IVC filter placement. Monitor daily CBC, especially to watch platelet count. Hold Revlimid for now given thrombocytopenia, thromboembolic episode, and also since the patient states that myeloma has been progressing upon most recent evaluation. Further myeloma management can be planned once acute issues improve. Will request his previous oncologist, Dr. Grayland Ormond here to continue to follow starting tomorrow. The patient and family were explained above, agreeable to this plan.   Thank you for the referral. Please feel free to contact me if any additional questions. ____________________________ Rhett Bannister Ma Hillock, MD srp:slb D: 11/04/2011 10:29:00 ET T: 11/04/2011 10:59:14 ET JOB#: [id number]  cc: Guiliana Shor R. Ma Hillock, MD, <Dictator> Alveta Heimlich MD ELECTRONICALLY SIGNED 11/04/2011 12:12

## 2015-02-09 NOTE — Discharge Summary (Signed)
PATIENT NAMECARLITOS, [name] MR#:  [id number] DATE OF BIRTH:  [birth year]  DATE OF ADMISSION:  11/03/2011 DATE OF DISCHARGE:  11/09/2011  ADMITTING PHYSICIAN: Max Sane, MD   DISCHARGING PHYSICIAN: Gladstone Lighter, MD    PRIMARY CARE PHYSICIAN: Miguel Aschoff, MD   PRIMARY ONCOLOGIST: Dr. Terrilyn Saver at [facility].   CONSULTATIONS IN THE HOSPITAL:  1. Oncology consultation by Dr. Leia Alf and also Dr. Grayland Ormond   2. Vascular consultation by Dr. Leotis Pain   3. Nephrology consultation by Dr. Juleen China and Dr. Anthonette Legato    DISCHARGE DIAGNOSES:  1. Pulmonary embolism and also right lower extremity deep vein thrombosis status post IVC filter placement.  2. Chronic kidney disease, stage IV with baseline creatinine around 3.  3. Multiple myeloma, on chemotherapy.  4. Chronic anemia.  5. Chronic thrombocytopenia.  6. Insulin dependent diabetes mellitus.  7. History of bladder cell carcinoma, status post resection.  8. Congestive heart failure with systolic dysfunction, unknown ejection fraction, appears well compensated now.   DISCHARGE MEDICATIONS:  1. Fosrenol 1000 mg p.o. t.i.d.  2. Magnesium 400 mg p.o. b.i.d.  3. Latanoprost 0.005% ophthalmic inhalation one drop to each eye once a day.  4. Hectorol, which is calcitriol, 0.5 mcg p.o. daily.  5. Humulin R 100 units/mL injectable solution on a sliding scale as needed prior to meals.  6. Lantus 25 units sub-Q at bedtime.  7. Coumadin 4 mg p.o. daily.  8. Sodium bicarbonate 650 mg p.o. b.i.d.    DISCHARGE DIET: ADA diet.   DISCHARGE ACTIVITY: As tolerated.    FOLLOW-UP INSTRUCTIONS:  1. Follow-up with Dr. Terrilyn Saver at [facility] in one week. The patient already has an appointment for next Tuesday, 11/16/2011.  2. INR check in 3 to 5 days.  3. Primary care physician follow-up in one week.  4. Follow-up with Dr. Lucky Cowboy in 3 to 4 weeks.  5. Nephrology follow-up in three weeks.   Appointments have been set up.  Please see discharge instructions for the appointment times.   LABS AT THE TIME OF DISCHARGE: WBC 4.2, hemoglobin 8.4, hematocrit 25.4, platelet count 76, sodium 143, potassium 4.3, chloride 110, bicarb 19, BUN 42, creatinine 3.11, glucose 151, calcium 8.7. INR at the time of discharge 2.6. Urinalysis with 3+ blood, 30 mg/dL of protein but no infection evident. Hemoglobin A1c elevated at 8.2.   Chest x-ray on admission showing hypoventilation, bibasilar atelectasis with trace effusions.   Ultrasound of the lower extremities showing nonocclusive thrombus in right lower extremity extending from common femoral vein into the popliteal vein. Superficial vein thrombosis is also seen in the saphenous vein. No evidence of left lower extremity deep venous thrombosis.   Nuclear medicine V/Q scan showing two large segmental perfusion ventilation mismatched defects in the anterior right upper lobe and also superior segment of the right lower lobe. These findings are consistent with high probability for pulmonary embolic disease.   BRIEF HOSPITAL COURSE: Mr. [name] is a 80 year old man with history of multiple myeloma on Revlimid and dexamethasone being followed at [facility], history of bladder cell cancer status post TURP and bladder tumor removal with instillation of mitomycin, hypertension, with history of chronic anemia and thrombocytopenia who came to the Emergency Room complaining of right shoulder pain and pleuritic right-sided chest pain associated with dyspnea. Because of his known history of chronic kidney disease, he had a V/Q scan done in the ED which showed high probability for PE with two mismatched defects on  the right side. His right lower extremity Doppler's were also positive for right lower extremity DVT. He was admitted for the same.  1. PE and right lower extremity DVT with known history of anemia, thrombocytopenia, and multiple myeloma. He was seen by oncologist, Dr. Ma Hillock, and also Dr.  Lucky Cowboy. He had an IVC filter placed on 11/04/2011. He was placed on IV heparin drip along with Coumadin. His INR was therapeutic on the day of discharge so IV heparin drip was stopped. He is being discharged home on Coumadin and a follow-up INR in the next 3 to 4 days. Because his risk for coagulopathy is high with underlying multiple myeloma, he might need lifelong anticoagulation but he was advised to follow-up with his hematologist and oncologist at [facility].  2. Chronic anemia and thrombocytopenia. His blood and platelet counts have remained stable while in the hospital. He just got Nplate injection for improving platelet counts prior to admission from his oncologist so he got another dose of Nplate on the day of discharge on 11/09/2011. He also got a dose of Epo prior to discharge for his anemia and he was advised to follow-up with his hematologist.  3. Multiple myeloma. He was on Revlimid and Decadron. He has to follow-up with oncologist on whether to further continue therapy at this time or not.  4. CKD, stage IV. Creatinine has remained stable while in the hospital. He was followed by Nephrology. 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 He was placed on bicarb 650 mg p.o. b.i.d. for his metabolic acidosis on admission and will be discharged on the same dosage to be followed up as an outpatient.   His course has been otherwise uneventful in the hospital. He has underlying diabetes for which Lantus was adjust and he is on 25 units of Lantus along with sliding scale insulin. He has ambulated well and saturated well on room air on ambulation.   DISCHARGE CONDITION: Stable.   DISCHARGE DISPOSITION: Home.   TIME SPENT ON DISCHARGE: 45 minutes.   ____________________________ Gladstone Lighter, MD rk:drc D: 11/11/2011 15:35:13 ET T: 11/12/2011 10:19:28 ET JOB#: [id number]  cc: Gladstone Lighter, MD, <Dictator> Dr. Terrilyn Saver, [facility] Oncology  Gladstone Lighter  MD ELECTRONICALLY SIGNED 11/12/2011 14:44

## 2015-02-09 NOTE — Op Note (Signed)
PATIENT [name], [name] MR#:  [id number] DATE OF BIRTH:  [birth year]  DATE OF PROCEDURE:  03/20/2012  PREOPERATIVE DIAGNOSIS: Transitional cell carcinoma of the bladder.   POSTOPERATIVE DIAGNOSIS: Transitional cell carcinoma of the bladder.    PROCEDURES: TUR of bladder tumors, multiple, instillation of mitomycin.   SURGEON: Rica KoyanagiJohn S. Amairani Shuey, MD  ANESTHESIA: General.  INDICATIONS: This 80 year old man has had recurrent transitional cell carcinoma of the bladder. On his last surveillance cystoscopy it was noted that he had a lesion on the right bladder wall lateral to the trigone. He is scheduled for resection. In the past some of his tumor has been classified as high grade therefore he will receive mitomycin following his resection.   DESCRIPTION OF PROCEDURE: In the dorsal lithotomy position under general anesthesia, the genital area was prepped and draped for endoscopic work. The #21 cystoscope was introduced and a thorough inspection of the bladder performed. Three separate lesions are present. One large one on the right lateral wall, a smaller one further laterally and posteriorly and a small one on the left <<MItrigoneSISNG TEXT>> area. In aggregate there is approximately 4 cm of overall bladder surface involved with tumor.   Following inspection the Stern-McCarthy resectoscope was introduced and the lesions were resected down to the muscle fibers of the bladder wall. Bleeding points were identified and cauterized. All fragments were removed from the bladder  and submitted for pathologic study. 40 mg of mitomycin was then introduced after the placement of a Foley catheter.  This solution will be left in place for one hour. Patient tolerated the procedure well and returned to the recovery area in satisfactory condition.   ____________________________ Rica KoyanagiJohn S. Mainor Hellmann, MD jsh:cms D: 03/20/2012 16:49:34 ET T: 03/21/2012 09:40:39 ET  JOB#: [id number] cc: Rica KoyanagiJohn S. Elmarie Devlin, MD, <Dictator> Rica KoyanagiJOHN S  Shruthi Northrup MD ELECTRONICALLY SIGNED 03/23/2012 16:06

## 2015-02-09 NOTE — Consult Note (Signed)
Patient not in his room for evaluation, but did discuss case at length with his wife.  Agree with IVC filter and holding Revlimid.  Continue to monitor daily CBC.  Plan to further discuss the case with patient's primary oncologist, [facility] Raye [facility]orrowPeter Voorhees[facility].  Will follow.  Electronic Signatures: Gerarda FractionFinnegan, Timothy (MD)  (Signed on 17-Jan-13 14:41)  Authored  Last Updated: 17-Jan-13 14:41 by Gerarda FractionFinnegan, Timothy (MD)
